# Patient Record
Sex: Female | Born: 1985
Health system: Southern US, Community
[De-identification: ages and names within clinical notes are randomized; demographics above are authoritative.]

## PROBLEM LIST (undated history)

## (undated) ENCOUNTER — Inpatient Hospital Stay (HOSPITAL_COMMUNITY): Payer: Self-pay

## (undated) DIAGNOSIS — F419 Anxiety disorder, unspecified: Secondary | ICD-10-CM

## (undated) DIAGNOSIS — J302 Other seasonal allergic rhinitis: Secondary | ICD-10-CM

## (undated) DIAGNOSIS — R87619 Unspecified abnormal cytological findings in specimens from cervix uteri: Secondary | ICD-10-CM

## (undated) DIAGNOSIS — J45909 Unspecified asthma, uncomplicated: Secondary | ICD-10-CM

## (undated) DIAGNOSIS — B999 Unspecified infectious disease: Secondary | ICD-10-CM

## (undated) DIAGNOSIS — J209 Acute bronchitis, unspecified: Secondary | ICD-10-CM

## (undated) DIAGNOSIS — IMO0002 Reserved for concepts with insufficient information to code with codable children: Secondary | ICD-10-CM

## (undated) DIAGNOSIS — E669 Obesity, unspecified: Secondary | ICD-10-CM

## (undated) DIAGNOSIS — D573 Sickle-cell trait: Secondary | ICD-10-CM

## (undated) DIAGNOSIS — A749 Chlamydial infection, unspecified: Secondary | ICD-10-CM

## (undated) DIAGNOSIS — N390 Urinary tract infection, site not specified: Secondary | ICD-10-CM

## (undated) HISTORY — DX: Other seasonal allergic rhinitis: J30.2

## (undated) HISTORY — DX: Unspecified asthma, uncomplicated: J45.909

## (undated) HISTORY — DX: Acute bronchitis, unspecified: J20.9

## (undated) HISTORY — DX: Obesity, unspecified: E66.9

## (undated) HISTORY — PX: WISDOM TOOTH EXTRACTION: SHX21

## (undated) HISTORY — PX: ABDOMINAL SURGERY: SHX537

---
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
## 2004-04-09 ENCOUNTER — Emergency Department (HOSPITAL_COMMUNITY): Admission: EM | Admit: 2004-04-09 | Discharge: 2004-04-09 | Payer: Self-pay | Admitting: Emergency Medicine

## 2007-09-04 ENCOUNTER — Emergency Department (HOSPITAL_COMMUNITY): Admission: EM | Admit: 2007-09-04 | Discharge: 2007-09-04 | Payer: Self-pay | Admitting: Emergency Medicine

## 2007-11-13 ENCOUNTER — Emergency Department (HOSPITAL_COMMUNITY): Admission: EM | Admit: 2007-11-13 | Discharge: 2007-11-13 | Payer: Self-pay | Admitting: Emergency Medicine

## 2010-12-20 ENCOUNTER — Inpatient Hospital Stay (INDEPENDENT_AMBULATORY_CARE_PROVIDER_SITE_OTHER)
Admission: RE | Admit: 2010-12-20 | Discharge: 2010-12-20 | Disposition: A | Discharge status: Home / Self Care | Payer: 59 | Source: Ambulatory Visit | Attending: Emergency Medicine | Admitting: Emergency Medicine

## 2010-12-20 DIAGNOSIS — J45909 Unspecified asthma, uncomplicated: Secondary | ICD-10-CM

## 2010-12-20 DIAGNOSIS — J309 Allergic rhinitis, unspecified: Secondary | ICD-10-CM

## 2010-12-20 LAB — POCT RAPID STREP A (OFFICE): Streptococcus, Group A Screen (Direct): NEGATIVE

## 2010-12-26 ENCOUNTER — Encounter: Payer: Self-pay | Admitting: *Deleted

## 2010-12-26 ENCOUNTER — Ambulatory Visit (INDEPENDENT_AMBULATORY_CARE_PROVIDER_SITE_OTHER): Payer: 59 | Admitting: Family Medicine

## 2010-12-26 ENCOUNTER — Encounter: Payer: Self-pay | Admitting: Family Medicine

## 2010-12-26 DIAGNOSIS — E669 Obesity, unspecified: Secondary | ICD-10-CM | POA: Insufficient documentation

## 2010-12-26 DIAGNOSIS — J209 Acute bronchitis, unspecified: Secondary | ICD-10-CM

## 2010-12-26 DIAGNOSIS — J45909 Unspecified asthma, uncomplicated: Secondary | ICD-10-CM | POA: Insufficient documentation

## 2010-12-26 DIAGNOSIS — F4320 Adjustment disorder, unspecified: Secondary | ICD-10-CM

## 2011-01-05 NOTE — Assessment & Plan Note (Signed)
Summary: NEW PT TO EST/ASTHMA/CLE  [facility]   Vital Signs:  Patient profile:   25 year old female Height:      63.5 inches Weight:      190.25 pounds BMI:     33.29 O2 Sat:      97 % on Room air Temp:     98.5 degrees F oral Pulse rate:   60 / minute Pulse rhythm:   regular BP sitting:   104 / 70  (left arm) Cuff size:   large  Vitals Entered By: Selena Batten Dance CMA Duncan Dull) (December 26, 2010 10:38 AM)  O2 Flow:  Room air CC: New patient-asthma and wants to discuss wt loss   History of Present Illness: CC: new patient, establish  moved back down from [place] recently.  1. weight - since has been here, noticing weight coming up.  has never been 190lbs.  does notice has not been staying as active as should.  2. cold - 1 wk h/o coughing and cold.  given asthma medicine for nebulizer and IM shot of something at urgent care.  cough productive of green sputum.  Started with ST, itchy eyes.  Now hoarse voice with cough.  No fevers/chills, nausea/vomiting, abd pain.  + friend sick with cold recently.  sounds dry.  having episodes of coughing leading to gaggin.  3. asthma - usually takes albuteorl inhaler.  If doesn't work, goes to hospital.  Started taking zyrtec for allergies.  last hospitalization was as child.  No intubation.  triggers are allergies, weather, stress, colds, dust, trees, 2nd hand smoke.  Never been on controller med.  normally doesn't need albuterol.  Has albuterol inh and neb at home.    4. depression - issues about being alone.  feels mother doesn't understand what she goes through living here.  has number from work for counselor, hasn't called yet.    LMP - last month, not really regular, sometimes 1 1/2 mo between periods.  no OCP. no recent well woman exam. last pap smear - 3-4 years.  went to GYN in past, told had infection (BV).    -  Date:  10/09/2002    TD booster Td  Current Medications (verified): 1)  Ventolin Hfa 108 (90 Base) Mcg/act Aers (Albuterol Sulfate)  .... 2 Puffs Q4-6 Hours As Needed Cough/sob 2)  Zyrtec Allergy 10 Mg Tabs (Cetirizine Hcl) .Marland Kitchen.. 1 By Mouth Once Daily 3)  Albuterol Sulfate (5 Mg/ml) 0.5% Nebu (Albuterol Sulfate) .... One Neb Q4-6 Hours As Needed Asthma Flare  Allergies (verified): 1)  ! Pcn  Past History:  Past Medical History: allergies asthma overweight  Past Surgical History: wisdom teeth removed  Family History: M: DM, HTN, HLD, mini CVA MGM: DM, HTN  no CAD/MI, CA  doesn't know father's side  Social History: no smoking, no EtOH, no rec drugs caffeine: 1 bottle soda/day occupation: [employer], Clinical biochemist Rep Lives alone, no pets Edu: BS degree  Review of Systems       The patient complains of prolonged cough.  The patient denies anorexia, fever, weight loss, weight gain, vision loss, decreased hearing, hoarseness, chest pain, syncope, dyspnea on exertion, peripheral edema, headaches, hemoptysis, abdominal pain, melena, hematochezia, severe indigestion/heartburn, hematuria, depression, and breast masses.         supposed to wear glasses but doesn't rest of 10 point ros neg.  Physical Exam  General:  Well-developed,well-nourished,in no acute distress; alert,appropriate and cooperative throughout examination Head:  Normocephalic and atraumatic without obvious abnormalities.  No apparent alopecia or balding. Eyes:  PERRLA, EOMI, no injection Ears:  TMs clear, covered by cermen bilaterally Nose:  nares clear Mouth:  MMM, no pharyngeal erythema Neck:  No deformities, masses, or tenderness noted.  no TM, no LAD Lungs:  Normal respiratory effort, chest expands symmetrically. Lungs are clear to auscultation, no crackles or wheezes. Heart:  Normal rate and regular rhythm. S1 and S2 normal without gallop, murmur, click, rub or other extra sounds. Abdomen:  Bowel sounds positive,abdomen soft and non-tender without masses, organomegaly or hernias noted. Msk:  No deformity or scoliosis noted of thoracic or  lumbar spine.   Pulses:  2+ rad pulses, brisk cap refill Extremities:   no pedal edema Neurologic:  CN grossly intact, station and gait intact Skin:  Intact without suspicious lesions or rashes Psych:  full affect, pleasant and cooperative    Impression & Recommendations:  Problem # 1:  ASTHMA (ICD-493.90) lungs clear currently.   doesn't sound like asthma exca currently. recommend continued scheduled albuterol for next few days.   if not improving, call us for consideration of steroid course. not on controller med.  if happening regularly, set up with controllre med.  Her updated medication list for this problem includes:    Ventolin Hfa 108 (90 Base) Mcg/act Aers (Albuterol sulfate) .Marland Kitchen... 2 puffs q4-6 hours as needed cough/sob    Albuterol Sulfate (5 Mg/ml) 0.5% Nebu (Albuterol sulfate) ..... One neb q4-6 hours as needed asthma flare  Pulmonary Functions Reviewed: O2 sat: 97 (12/26/2010)  Problem # 2:  ACUTE BRONCHITIS (ICD-466.0)  given mainly cough now >1 wk with some posttussive emesis, cover atypicals with azithromycin.  see pt instructions.  Her updated medication list for this problem includes:    Ventolin Hfa 108 (90 Base) Mcg/act Aers (Albuterol sulfate) .Marland Kitchen... 2 puffs q4-6 hours as needed cough/sob    Albuterol Sulfate (5 Mg/ml) 0.5% Nebu (Albuterol sulfate) ..... One neb q4-6 hours as needed asthma flare    Zithromax Z-pak 250 Mg Tabs (Azithromycin) ..... Use as directed    Tessalon Perles 100 Mg Caps (Benzonatate) ..... One by mouth three times a day as needed cough, swallow don't chew    Cheratussin Ac 100-10 Mg/85ml Syrp (Guaifenesin-codeine) ..... One teaspoon q6 hours as needed cough, sedation precautions  Problem # 3:  OBESITY (ICD-278.00) discussed healthy eating, discussed incorporating exercise into routine. rtc 1 mo for f/u.   pt thinks could commit to increased water during day for next visit.  Ht: 63.5 (12/26/2010)   Wt: 190.25 (12/26/2010)   BMI: 33.29  (12/26/2010)  Problem # 4:  ADJUSTMENT DISORDER (ICD-309.9) to new life in [place] discussed pt checking with counselor from work. return in 1 mo for f/u.  Problem # 5:  Preventive Health Care (ICD-V70.0) due for well woman  Complete Medication List: 1)  Ventolin Hfa 108 (90 Base) Mcg/act Aers (Albuterol sulfate) .... 2 puffs q4-6 hours as needed cough/sob 2)  Zyrtec Allergy 10 Mg Tabs (Cetirizine hcl) .Marland Kitchen.. 1 by mouth once daily 3)  Albuterol Sulfate (5 Mg/ml) 0.5% Nebu (Albuterol sulfate) .... One neb q4-6 hours as needed asthma flare 4)  Zithromax Z-pak 250 Mg Tabs (Azithromycin) .... Use as directed 5)  Tessalon Perles 100 Mg Caps (Benzonatate) .... One by mouth three times a day as needed cough, swallow don't chew 6)  Cheratussin Ac 100-10 Mg/72ml Syrp (Guaifenesin-codeine) .... One teaspoon q6 hours as needed cough, sedation precautions  Patient Instructions: 1)  Call to explore options from work about  counseling. 2)  For cough - treat with azithromycin. 3)  Cheratussin at night.  tessalon during day. 4)  continue albuterol inhaler. 5)  if worsening or fevers >101.5, or worsening shortness of breath, let us know.  we may want to see you back here. 6)  Push fluids and plenty of rest.  mucinex with plenty of fluid to break up mucous. 7)  return 1 mo for follow up.  For next month, "I will try to increase water daily, decrease juices and soda". Prescriptions: CHERATUSSIN AC 100-10 MG/5ML SYRP (GUAIFENESIN-CODEINE) one teaspoon q6 hours as needed cough, sedation precautions  #120cc x 0   Entered and Authorized by:   Eustaquio Boyden  MD   Signed by:   Eustaquio Boyden  MD on 12/26/2010   Method used:   Print then Give to Patient   RxID:   [id number] TESSALON PERLES 100 MG CAPS (BENZONATATE) one by mouth three times a day as needed cough, swallow don't chew  #30 x 0   Entered and Authorized by:   Eustaquio Boyden  MD   Signed by:   Eustaquio Boyden  MD on 12/26/2010   Method used:    Electronically to        [facility] (retail)       [address]       [place], [place]  [address]       Ph: [phone]       Fax: [phone]   RxID:   [id number] ZITHROMAX Z-PAK 250 MG TABS (AZITHROMYCIN) use as directed  #1 x 0   Entered and Authorized by:   Eustaquio Boyden  MD   Signed by:   Eustaquio Boyden  MD on 12/26/2010   Method used:   Electronically to        [facility] (retail)       [address]       [place], [place]  [address]       Ph: [phone]       Fax: [phone]   RxID:   [id number] VENTOLIN HFA 108 (90 BASE) MCG/ACT AERS (ALBUTEROL SULFATE) 2 puffs q4-6 hours as needed cough/sob  #1 x 11   Entered and Authorized by:   Eustaquio Boyden  MD   Signed by:   Eustaquio Boyden  MD on 12/26/2010   Method used:   Electronically to        [facility] (retail)       [address]       [place], [place]  [address]       Ph: [phone]       Fax: [phone]   RxID:   [id number] ALBUTEROL SULFATE (5 MG/ML) 0.5% NEBU (ALBUTEROL SULFATE) one neb q4-6 hours as needed asthma flare  #1 x 3   Entered and Authorized by:   Eustaquio Boyden  MD   Signed by:   Eustaquio Boyden  MD on 12/26/2010   Method used:   Electronically to        [facility] (retail)       [address]       [place], [place]  [address]       Ph: [phone]       Fax: [phone]   RxID:   [id number]  Orders Added: 1)  New Patient Level IV [[address]]    Current Allergies (reviewed today): ! PCN

## 2011-01-05 NOTE — Letter (Signed)
Summary: Out of Work  [facility] [employer] [facility] at [facility]  [address] [place], [place] [address]   Phone: [phone]  Fax: [phone]    December 26, 2010   Employee:  RYHANNA DUNSMORE    To Whom It May Concern:   For Medical reasons, please excuse the above named employee from work for the following dates:  Start:  December 26, 2010   End:  December 26, 2010   If you need additional information, please feel free to contact our office.         Sincerely,       Selena Batten Tallen Schnorr CMA (AAMA)

## 2011-01-25 ENCOUNTER — Encounter: Payer: Self-pay | Admitting: Family Medicine

## 2011-01-27 ENCOUNTER — Encounter: Payer: Self-pay | Admitting: Family Medicine

## 2011-01-27 ENCOUNTER — Ambulatory Visit (INDEPENDENT_AMBULATORY_CARE_PROVIDER_SITE_OTHER): Payer: 59 | Admitting: Family Medicine

## 2011-01-27 DIAGNOSIS — E669 Obesity, unspecified: Secondary | ICD-10-CM

## 2011-01-27 DIAGNOSIS — F4320 Adjustment disorder, unspecified: Secondary | ICD-10-CM

## 2011-01-27 DIAGNOSIS — J45909 Unspecified asthma, uncomplicated: Secondary | ICD-10-CM

## 2011-01-27 NOTE — Assessment & Plan Note (Signed)
PEF 440 L/min.  Expected for her is ~440.  Lungs clear.  Discussed avoidance of triggers, no controller med for now.

## 2011-01-27 NOTE — Patient Instructions (Signed)
I/m glad asthma is doing better. Good job with changing to water and weight loss! Now we will work on increasing exercise to maintain weight loss.  Slowly increase activity to goal of 30-45 min daily. If you feel things are becoming overwhelming, let us know to set you up with counseling. Once you find out about insurance, let us know if you'd like [place] to set you up with GYN for well woman exam as you're due. Good to see you today, call us with questions.

## 2011-01-27 NOTE — Assessment & Plan Note (Signed)
Congratulated on weight loss, recommend increased activity and discussed slowly titrating to goal of 30-75min more days than not out of week.

## 2011-01-27 NOTE — Progress Notes (Signed)
  Subjective:    Patient ID: Joanna Padilla, female    DOB: July 14, 1986, 25 y.o.   MRN: [id number]  HPI CC: f/u issues  1. Weight - weight down 5 lbs since last visit and only change has been increasing water and cutting back on juice.  Noticing more regular stools with increased water.  Not increased activity yet.  2. Adjustment issues - didn't call counselor at work.  Still sometimes with trouble adjusting to life in [place] compared to [facility].  Doesn't feel overwhelmed currently.  Lost job yesterday.  Wants to go back to school and get master's, find another job, file for unemployment.  3. Asthma - better.  Only using albuterol a few times since last checked.  triggers are allergies, weather, stress, colds, dust, trees, 2nd hand smoke.  In 2011 had 1-2 flares.  Not on controller med.    Well woman - 3-4 years ago.  Due but wants to wait as unsure about insurance with job loss.  Review of Systems Per HPI    Objective:   Physical Exam  Constitutional: She appears well-developed and well-nourished. No distress.  HENT:  Head: Normocephalic and atraumatic.  Right Ear: External ear normal.  Left Ear: External ear normal.  Mouth/Throat: Oropharynx is clear and moist. No oropharyngeal exudate.  Eyes: Conjunctivae and EOM are normal. Pupils are equal, round, and reactive to light. No scleral icterus.  Neck: Normal range of motion. Neck supple. No thyromegaly present.  Cardiovascular: Normal rate, regular rhythm, normal heart sounds and intact distal pulses.   No murmur heard. Pulmonary/Chest: Effort normal and breath sounds normal. No respiratory distress. She has no wheezes. She has no rales.  Lymphadenopathy:    She has no cervical adenopathy.  Neurological: She is alert.  Skin: Skin is warm and dry. No rash noted.  Psychiatric: She has a normal mood and affect. Her behavior is normal. Judgment and thought content normal.          Assessment & Plan:

## 2011-01-27 NOTE — Assessment & Plan Note (Signed)
In setting of recent job loss exacerbating but she feels she's calm and handling it well.  Has plan to return either to school or find new job closer to what her degree is. Discussed if overwhelmed, let us know for referral to counseling.

## 2011-05-01 ENCOUNTER — Encounter (HOSPITAL_COMMUNITY): Payer: Self-pay | Admitting: *Deleted

## 2011-05-01 ENCOUNTER — Inpatient Hospital Stay (HOSPITAL_COMMUNITY)
Admission: AD | Admit: 2011-05-01 | Discharge: 2011-05-01 | Disposition: A | Discharge status: Home / Self Care | Payer: Medicaid Other | Source: Ambulatory Visit | Attending: Obstetrics & Gynecology | Admitting: Obstetrics & Gynecology

## 2011-05-01 DIAGNOSIS — R5381 Other malaise: Secondary | ICD-10-CM | POA: Insufficient documentation

## 2011-05-01 DIAGNOSIS — R5383 Other fatigue: Secondary | ICD-10-CM | POA: Insufficient documentation

## 2011-05-01 DIAGNOSIS — Z3490 Encounter for supervision of normal pregnancy, unspecified, unspecified trimester: Secondary | ICD-10-CM

## 2011-05-01 DIAGNOSIS — Z3480 Encounter for supervision of other normal pregnancy, unspecified trimester: Secondary | ICD-10-CM

## 2011-05-01 DIAGNOSIS — R110 Nausea: Secondary | ICD-10-CM | POA: Insufficient documentation

## 2011-05-01 LAB — URINALYSIS, ROUTINE W REFLEX MICROSCOPIC
Nitrite: NEGATIVE
Specific Gravity, Urine: <1.005 — ABNORMAL LOW (ref 1.005–1.030)
Urobilinogen, UA: 0.2 mg/dL (ref 0.0–1.0)
pH: 5.5 (ref 5.0–8.0)

## 2011-05-01 LAB — POCT PREGNANCY, URINE: Preg Test, Ur: POSITIVE

## 2011-05-01 NOTE — ED Provider Notes (Signed)
History     Chief Complaint  Patient presents with  . Nausea  . Fatigue   HPI  25 yo G1 @ [redacted]w[redacted]d. Lightheaded and nauseous this afternoon, had normal BM, felt a little better after eating spaghetti around 4PM, then ate [employer] chicken fingers, fries and sweet tea at 7 PM and felt lightheaded and nauseous. No vomiting and no diarrhea. No pain, no vaginal bleeding. Started prenatal care, had one visit, but can't remember who she is seeing, has next appt on 8/2.     Past Medical History  Diagnosis Date  . Acute bronchitis   . Unspecified adjustment reaction   . Unspecified asthma   . Obesity, unspecified   . Seasonal allergies     Past Surgical History  Procedure Date  . Wisdom tooth extraction     Family History  Problem Relation Age of Onset  . Diabetes Mother   . Hypertension Mother   . Hyperlipidemia Mother   . Transient ischemic attack Mother   . Diabetes Maternal Grandmother   . Hypertension Maternal Grandmother     History  Substance Use Topics  . Smoking status: Never Smoker   . Smokeless tobacco: Not on file  . Alcohol Use: No    Allergies:  Allergies  Allergen Reactions  . Penicillins Other (See Comments)    REACTION: Hives    No prescriptions prior to admission    Review of Systems  Constitutional: Positive for malaise/fatigue. Negative for fever and chills.  Respiratory: Negative.   Cardiovascular: Negative.   Gastrointestinal: Positive for nausea. Negative for vomiting, abdominal pain, diarrhea and constipation.  Genitourinary: Negative for dysuria, urgency, frequency, hematuria and flank pain.       Negative for vaginal bleeding, cramping/contractions  Musculoskeletal: Negative.   Neurological: Positive for dizziness.  Psychiatric/Behavioral: Negative.    Physical Exam   Blood pressure 123/65, pulse 78, temperature 98.3 F (36.8 C), temperature source Oral, resp. rate 16, height 5\' 3"  (1.6 m), weight 89.721 kg (197 lb 12.8 oz), last  menstrual period 03/16/2011.  Physical Exam  Constitutional: She is oriented to person, place, and time. She appears well-developed and well-nourished. No distress.  Cardiovascular: Normal rate and regular rhythm.   Respiratory: Effort normal.  GI: Soft. Bowel sounds are normal. She exhibits no distension and no mass. There is no tenderness. There is no rebound and no guarding.  Musculoskeletal: Normal range of motion.  Neurological: She is alert and oriented to person, place, and time.  Skin: Skin is warm and dry.  Psychiatric: She has a normal mood and affect.    MAU Course  Procedures    Assessment and Plan  25 yo G1P0 @ [redacted] wks EGA, normal pregnancy discomfort Rev'd normal pregnancy symptoms and warning signs Rev'd comfort measures F/U as scheduled or sooner as needed  Jontavius Rabalais 05/01/2011, 11:43 PM

## 2011-05-01 NOTE — Progress Notes (Signed)
Pt feeling nausea, weakness, frequency with urination.  Pt had a bleu cheese burger last night, woke up with nausea, no vomiting or diarrhea.

## 2011-07-15 ENCOUNTER — Emergency Department (HOSPITAL_COMMUNITY)
Admission: EM | Admit: 2011-07-15 | Discharge: 2011-07-15 | Disposition: A | Discharge status: Home / Self Care | Payer: Medicaid Other | Attending: Emergency Medicine | Admitting: Emergency Medicine

## 2011-07-15 ENCOUNTER — Emergency Department (HOSPITAL_COMMUNITY): Payer: Medicaid Other

## 2011-07-15 DIAGNOSIS — O2390 Unspecified genitourinary tract infection in pregnancy, unspecified trimester: Secondary | ICD-10-CM | POA: Insufficient documentation

## 2011-07-15 DIAGNOSIS — J45909 Unspecified asthma, uncomplicated: Secondary | ICD-10-CM | POA: Insufficient documentation

## 2011-07-15 DIAGNOSIS — A499 Bacterial infection, unspecified: Secondary | ICD-10-CM | POA: Insufficient documentation

## 2011-07-15 DIAGNOSIS — B9689 Other specified bacterial agents as the cause of diseases classified elsewhere: Secondary | ICD-10-CM | POA: Insufficient documentation

## 2011-07-15 DIAGNOSIS — O208 Other hemorrhage in early pregnancy: Secondary | ICD-10-CM | POA: Insufficient documentation

## 2011-07-15 DIAGNOSIS — N760 Acute vaginitis: Secondary | ICD-10-CM | POA: Insufficient documentation

## 2011-07-15 DIAGNOSIS — R109 Unspecified abdominal pain: Secondary | ICD-10-CM | POA: Insufficient documentation

## 2011-07-15 LAB — URINALYSIS, ROUTINE W REFLEX MICROSCOPIC
Bilirubin Urine: NEGATIVE
Glucose, UA: NEGATIVE mg/dL
Hgb urine dipstick: NEGATIVE
Ketones, ur: NEGATIVE mg/dL
Protein, ur: NEGATIVE mg/dL
pH: 6 (ref 5.0–8.0)

## 2011-07-15 LAB — WET PREP, GENITAL
Trich, Wet Prep: NONE SEEN
Yeast Wet Prep HPF POC: NONE SEEN

## 2011-07-15 LAB — URINE MICROSCOPIC-ADD ON

## 2011-07-17 LAB — GC/CHLAMYDIA PROBE AMP, GENITAL: Chlamydia, DNA Probe: NEGATIVE

## 2011-07-17 LAB — URINE CULTURE
Colony Count: 85000
Culture  Setup Time: 201210070157

## 2011-07-18 LAB — I-STAT 8, (EC8 V) (CONVERTED LAB)
Acid-Base Excess: 1
BUN: 10
Chloride: 106
HCT: 47 — ABNORMAL HIGH
Operator id: 146091
Potassium: 4
pH, Ven: 7.362 — ABNORMAL HIGH

## 2011-07-18 LAB — DIFFERENTIAL
Basophils Relative: 1
Eosinophils Relative: 1
Monocytes Absolute: 0.3
Monocytes Relative: 5
Neutro Abs: 4

## 2011-07-18 LAB — CBC
HCT: 41.6
Hemoglobin: 13.8
MCHC: 33.1
RBC: 4.95

## 2011-07-18 LAB — POCT I-STAT CREATININE: Creatinine, Ser: 1

## 2011-07-18 LAB — RAPID URINE DRUG SCREEN, HOSP PERFORMED: Barbiturates: NOT DETECTED

## 2011-08-29 ENCOUNTER — Emergency Department (HOSPITAL_COMMUNITY)
Admission: EM | Admit: 2011-08-29 | Discharge: 2011-08-30 | Disposition: A | Discharge status: Home / Self Care | Payer: Medicaid Other | Attending: Emergency Medicine | Admitting: Emergency Medicine

## 2011-08-29 ENCOUNTER — Encounter (HOSPITAL_COMMUNITY): Payer: Self-pay | Admitting: Emergency Medicine

## 2011-08-29 DIAGNOSIS — F329 Major depressive disorder, single episode, unspecified: Secondary | ICD-10-CM

## 2011-08-29 DIAGNOSIS — J45909 Unspecified asthma, uncomplicated: Secondary | ICD-10-CM | POA: Insufficient documentation

## 2011-08-29 DIAGNOSIS — J309 Allergic rhinitis, unspecified: Secondary | ICD-10-CM | POA: Insufficient documentation

## 2011-08-29 DIAGNOSIS — Z79899 Other long term (current) drug therapy: Secondary | ICD-10-CM | POA: Insufficient documentation

## 2011-08-29 DIAGNOSIS — E669 Obesity, unspecified: Secondary | ICD-10-CM | POA: Insufficient documentation

## 2011-08-29 DIAGNOSIS — F32A Depression, unspecified: Secondary | ICD-10-CM

## 2011-08-29 DIAGNOSIS — R45851 Suicidal ideations: Secondary | ICD-10-CM | POA: Insufficient documentation

## 2011-08-29 LAB — RAPID URINE DRUG SCREEN, HOSP PERFORMED
Barbiturates: NOT DETECTED
Benzodiazepines: NOT DETECTED
Cocaine: NOT DETECTED
Tetrahydrocannabinol: NOT DETECTED

## 2011-08-29 LAB — COMPREHENSIVE METABOLIC PANEL
BUN: 5 mg/dL — ABNORMAL LOW (ref 6–23)
CO2: 25 mEq/L (ref 19–32)
Calcium: 10.6 mg/dL — ABNORMAL HIGH (ref 8.4–10.5)
Creatinine, Ser: 0.52 mg/dL (ref 0.50–1.10)
GFR calc Af Amer: >90 mL/min (ref >90)
GFR calc non Af Amer: >90 mL/min (ref >90)
Glucose, Bld: 128 mg/dL — ABNORMAL HIGH (ref 70–99)

## 2011-08-29 LAB — CBC
Hemoglobin: 11.4 g/dL — ABNORMAL LOW (ref 12.0–15.0)
MCH: 28.2 pg (ref 26.0–34.0)
MCV: 82.7 fL (ref 78.0–100.0)
RBC: 4.04 MIL/uL (ref 3.87–5.11)

## 2011-08-29 MED ORDER — ACETAMINOPHEN 325 MG PO TABS: 650.0000 mg | ORAL_TABLET | ORAL | Status: DC | PRN
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
## 2011-08-30 ENCOUNTER — Encounter (HOSPITAL_COMMUNITY): Payer: Self-pay | Admitting: *Deleted

## 2011-08-30 NOTE — ED Notes (Signed)
Pt discharged to self and verbalizes understanding of discharge instructions. Departs unit on safe condition.
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
## 2012-10-09 NOTE — L&D Delivery Note (Signed)
Called to see the patient after delivery because of pain in vagina and possible and vaginal hematoma.  Epidural was redosed.  Pt had adequate pain control.  Upon exam, no cervical lacerations were seen.  Pt was seen to have a left side wall vaginal hematoma measuring about 4 cm.  It was stable in size.  No further growth.  Her hemoglobin is stable.  There was a small laceration on the vaginal side wall which allowed the hematoma to drain.  Second degree periurethral laceration was still bleeding.  A figure of eight suture was placed to obtain hemostasis.  2- chromic was used.  I will plan to check serial hemoglobin levels q 4 hrs.  Keep pt NPO until second hemoglobin is done.  Ice and pressure to the perineum.  The pt understands and agrees with the plan

## 2012-10-09 NOTE — L&D Delivery Note (Signed)
Delivery Note At 3:17 PM a viable female, "Joanna Padilla", was delivered via VBAC, Spontaneous (Presentation: Right Occiput Anterior).  APGAR: 9, 9; weight .   Placenta status: Intact, Spontaneous.  Cord: 3 vessels with the following complications: None.  Cord pH: NA  Anesthesia: Epidural  Episiotomy: None Lacerations: Periurethral Suture Repair: 3.0 monocryl. Est. Blood Loss (mL): 200  Mom to postpartum.  Baby to skin to skin. Patient wants tubal--consent signed yesterday.  Will have pp visit at 4 weeks to prepare for tubal by 6 weeks.Marland Kitchen  Nigel Bridgeman 05/27/2013, 3:55 PM

## 2012-10-28 ENCOUNTER — Inpatient Hospital Stay (HOSPITAL_COMMUNITY): Payer: Medicaid Other

## 2012-10-28 ENCOUNTER — Encounter (HOSPITAL_COMMUNITY): Payer: Self-pay | Admitting: *Deleted

## 2012-10-28 ENCOUNTER — Inpatient Hospital Stay (HOSPITAL_COMMUNITY)
Admission: AD | Admit: 2012-10-28 | Discharge: 2012-10-28 | Disposition: A | Discharge status: Home / Self Care | Payer: Medicaid Other | Source: Ambulatory Visit | Attending: Obstetrics & Gynecology | Admitting: Obstetrics & Gynecology

## 2012-10-28 DIAGNOSIS — R109 Unspecified abdominal pain: Secondary | ICD-10-CM | POA: Insufficient documentation

## 2012-10-28 DIAGNOSIS — O26899 Other specified pregnancy related conditions, unspecified trimester: Secondary | ICD-10-CM

## 2012-10-28 DIAGNOSIS — O219 Vomiting of pregnancy, unspecified: Secondary | ICD-10-CM

## 2012-10-28 DIAGNOSIS — O99891 Other specified diseases and conditions complicating pregnancy: Secondary | ICD-10-CM | POA: Insufficient documentation

## 2012-10-28 DIAGNOSIS — O210 Mild hyperemesis gravidarum: Secondary | ICD-10-CM | POA: Insufficient documentation

## 2012-10-28 HISTORY — DX: Sickle-cell trait: D57.3

## 2012-10-28 LAB — URINALYSIS, ROUTINE W REFLEX MICROSCOPIC
Glucose, UA: NEGATIVE mg/dL
Hgb urine dipstick: NEGATIVE
Protein, ur: NEGATIVE mg/dL

## 2012-10-28 LAB — WET PREP, GENITAL
Trich, Wet Prep: NONE SEEN
Yeast Wet Prep HPF POC: NONE SEEN

## 2012-10-28 LAB — CBC
HCT: 33.9 % — ABNORMAL LOW (ref 36.0–46.0)
MCH: 27.9 pg (ref 26.0–34.0)
MCHC: 33.9 g/dL (ref 30.0–36.0)
MCV: 82.3 fL (ref 78.0–100.0)
RDW: 12.8 % (ref 11.5–15.5)

## 2012-10-28 LAB — RAPID URINE DRUG SCREEN, HOSP PERFORMED
Amphetamines: NOT DETECTED
Benzodiazepines: NOT DETECTED
Opiates: NOT DETECTED
Tetrahydrocannabinol: NOT DETECTED

## 2012-10-28 LAB — ABO/RH: ABO/RH(D): A POS

## 2012-10-28 LAB — COMPREHENSIVE METABOLIC PANEL
BUN: 5 mg/dL — ABNORMAL LOW (ref 6–23)
Calcium: 9.4 mg/dL (ref 8.4–10.5)
Creatinine, Ser: 0.64 mg/dL (ref 0.50–1.10)
GFR calc Af Amer: >90 mL/min (ref >90)
Glucose, Bld: 81 mg/dL (ref 70–99)
Total Protein: 7.1 g/dL (ref 6.0–8.3)

## 2012-10-28 MED ORDER — ONDANSETRON 4 MG PO TBDP
4.0000 mg | ORAL_TABLET | Freq: Once | ORAL | Status: AC
  Administered 2012-10-28: 4 mg via ORAL
  Filled 2012-10-28: qty 1

## 2012-10-28 MED ORDER — PROMETHAZINE HCL 25 MG PO TABS: 12.5000 mg | ORAL_TABLET | Freq: Four times a day (QID) | ORAL | Status: DC | PRN

## 2012-10-28 NOTE — MAU Provider Note (Signed)
History     CSN: [id number]  Arrival date and time: 10/28/12 1449   First Provider Initiated Contact with Patient 10/28/12 1513      Chief Complaint  Patient presents with  . Abdominal Pain   HPI Joanna Padilla is a 27 y.o. female @ [redacted]w[redacted]d gestation who presents to MAU with abdominal pain. The pain started 3 weeks ago. She describes the pain as sharp that comes and goes. The pain is located in the mid lower abdomen.  She rates the pain as 5/10. Associated symptoms include nausea, vomiting, feeling weak and dizzy. LMP 08/23/12. Last pap smear less than one year ago after delivery of her 65 month old infant and was abnormal and has not had follow up. No hx of STI's. Current sex partner x 4 months. The history was provided by the patient.      OB History    Grav Para Term Preterm Abortions TAB SAB Ect Mult Living   2 1 1       1       Past Medical History  Diagnosis Date  . Acute bronchitis   . Unspecified adjustment reaction   . Unspecified asthma   . Obesity, unspecified   . Seasonal allergies   . Depression   . Sickle cell trait     Past Surgical History  Procedure Date  . Wisdom tooth extraction   . Cesarean section     Family History  Problem Relation Age of Onset  . Diabetes Mother   . Hypertension Mother   . Hyperlipidemia Mother   . Transient ischemic attack Mother   . Diabetes Maternal Grandmother   . Hypertension Maternal Grandmother     History  Substance Use Topics  . Smoking status: Never Smoker   . Smokeless tobacco: Not on file  . Alcohol Use: No    Allergies:  Allergies  Allergen Reactions  . Penicillins Hives    Prescriptions prior to admission  Medication Sig Dispense Refill  . albuterol (PROVENTIL) (5 MG/ML) 0.5% nebulizer solution Take 2.5 mg by nebulization every 4 (four) hours as needed. For wheezing      . albuterol (VENTOLIN HFA) 108 (90 BASE) MCG/ACT inhaler Inhale 2 puffs into the lungs every 4 (four) hours as needed. For wheezing         Review of Systems  Constitutional: Negative for fever, chills, weight loss and malaise/fatigue (feeling tired).  HENT: Negative for ear pain, nosebleeds, congestion, sore throat and neck pain.   Eyes: Negative for blurred vision, double vision, photophobia and pain.  Respiratory: Positive for wheezing. Negative for cough and shortness of breath.   Cardiovascular: Negative for chest pain, palpitations and leg swelling.  Gastrointestinal: Positive for nausea, vomiting, abdominal pain and constipation. Negative for heartburn and diarrhea.  Genitourinary: Positive for frequency. Negative for dysuria and urgency.  Musculoskeletal: Positive for back pain. Negative for myalgias.  Skin: Negative for itching and rash.  Neurological: Positive for dizziness. Negative for sensory change, speech change, seizures, weakness and headaches.  Endo/Heme/Allergies: Does not bruise/bleed easily.  Psychiatric/Behavioral: Positive for depression. Negative for substance abuse. The patient is not nervous/anxious.    Blood pressure 117/55, pulse 59, temperature 99.1 F (37.3 C), temperature source Oral, resp. rate 18, last menstrual period 08/27/2012, SpO2 100.00%, unknown if currently breastfeeding.  Physical Exam  Nursing note and vitals reviewed. Constitutional: She is oriented to person, place, and time. She appears well-developed and well-nourished. No distress.  HENT:  Head: Normocephalic and atraumatic.  Eyes: EOM are normal.  Neck: Neck supple.  Cardiovascular: Normal rate.   Respiratory: Effort normal.  GI: Soft. There is tenderness.       Mildly tender lower abdomen with palpation. No rebound or guarding.  Genitourinary:       External genitalia without lesions. White discharge vaginal vault. Cervix long, closed, no CMT, no adnexal tenderness. Uterus slightly enlarged.   Musculoskeletal: Normal range of motion.  Neurological: She is alert and oriented to person, place, and time.  Skin: Skin is  warm and dry.  Psychiatric: She has a normal mood and affect. Her behavior is normal. Judgment and thought content normal.   Results for orders placed during the hospital encounter of 10/28/12 (from the past 24 hour(s))  GLUCOSE, CAPILLARY     Status: Normal   Collection Time   10/28/12  2:58 PM      Component Value Range   Glucose-Capillary 71  70 - 99 mg/dL  URINALYSIS, ROUTINE W REFLEX MICROSCOPIC     Status: Abnormal   Collection Time   10/28/12  3:15 PM      Component Value Range   Color, Urine YELLOW  YELLOW   APPearance CLEAR  CLEAR   Specific Gravity, Urine 1.020  1.005 - 1.030   pH 6.0  5.0 - 8.0   Glucose, UA NEGATIVE  NEGATIVE mg/dL   Hgb urine dipstick NEGATIVE  NEGATIVE   Bilirubin Urine NEGATIVE  NEGATIVE   Ketones, ur 40 (*) NEGATIVE mg/dL   Protein, ur NEGATIVE  NEGATIVE mg/dL   Urobilinogen, UA 1.0  0.0 - 1.0 mg/dL   Nitrite NEGATIVE  NEGATIVE   Leukocytes, UA NEGATIVE  NEGATIVE  URINE RAPID DRUG SCREEN (HOSP PERFORMED)     Status: Normal   Collection Time   10/28/12  3:15 PM      Component Value Range   Opiates NONE DETECTED  NONE DETECTED   Cocaine NONE DETECTED  NONE DETECTED   Benzodiazepines NONE DETECTED  NONE DETECTED   Amphetamines NONE DETECTED  NONE DETECTED   Tetrahydrocannabinol NONE DETECTED  NONE DETECTED   Barbiturates NONE DETECTED  NONE DETECTED  POCT PREGNANCY, URINE     Status: Abnormal   Collection Time   10/28/12  3:22 PM      Component Value Range   Preg Test, Ur POSITIVE (*) NEGATIVE  ABO/RH     Status: Normal (Preliminary result)   Collection Time   10/28/12  3:40 PM      Component Value Range   ABO/RH(D) A POS    CBC     Status: Abnormal   Collection Time   10/28/12  3:40 PM      Component Value Range   WBC 10.2  4.0 - 10.5 K/uL   RBC 4.12  3.87 - 5.11 MIL/uL   Hemoglobin 11.5 (*) 12.0 - 15.0 g/dL   HCT 45.4 (*) 09.8 - 11.9 %   MCV 82.3  78.0 - 100.0 fL   MCH 27.9  26.0 - 34.0 pg   MCHC 33.9  30.0 - 36.0 g/dL   RDW 14.7  82.9  - 56.2 %   Platelets 389  150 - 400 K/uL  HCG, QUANTITATIVE, PREGNANCY     Status: Abnormal   Collection Time   10/28/12  3:40 PM      Component Value Range   hCG, Beta Chain, Quant, S 42320 (*) <5 mIU/mL  COMPREHENSIVE METABOLIC PANEL     Status: Abnormal   Collection Time  10/28/12  3:40 PM      Component Value Range   Sodium 135  135 - 145 mEq/L   Potassium 4.2  3.5 - 5.1 mEq/L   Chloride 102  96 - 112 mEq/L   CO2 24  19 - 32 mEq/L   Glucose, Bld 81  70 - 99 mg/dL   BUN 5 (*) 6 - 23 mg/dL   Creatinine, Ser 4.54  0.50 - 1.10 mg/dL   Calcium 9.4  8.4 - 09.8 mg/dL   Total Protein 7.1  6.0 - 8.3 g/dL   Albumin 3.7  3.5 - 5.2 g/dL   AST 10  0 - 37 U/L   ALT 8  0 - 35 U/L   Alkaline Phosphatase 60  39 - 117 U/L   Total Bilirubin 0.4  0.3 - 1.2 mg/dL   GFR calc non Af Amer >90  >90 mL/min   GFR calc Af Amer >90  >90 mL/min    Procedures   When patient returned from ultrasound she states she is hungry.  Assessment: 27 y.o. female @ 8.[redacted] weeks gestation with abdominal pain   Nausea and vomiting in pregnancy  Plan:  Zofran 4 mg ODT now   Rx Phenergan   Zantac   Start prenatal care I have reviewed this patient's vital signs, nurses notes, appropriate labs and imaging.  I have discussed results with the patient and plan of care. Patient voices understanding.   Medication List     As of 10/28/2012  7:57 PM    START taking these medications         promethazine 25 MG tablet   Commonly known as: PHENERGAN   Take 0.5 tablets (12.5 mg total) by mouth every 6 (six) hours as needed for nausea.      CONTINUE taking these medications         * albuterol (5 MG/ML) 0.5% nebulizer solution   Commonly known as: PROVENTIL      * VENTOLIN HFA 108 (90 BASE) MCG/ACT inhaler   Generic drug: albuterol     * Notice: This list has 2 medication(s) that are the same as other medications prescribed for you. Read the directions carefully, and ask your doctor or other care provider to review  them with you.        Where to get your medications    These are the prescriptions that you need to pick up. We sent them to a specific pharmacy, so you will need to go there to get them.   [id number] [place], [place] - [address]    [address] [place] [address]    Phone: [phone]        promethazine 25 MG tablet             Nivia Gervase, RN, FNP, [facility] 10/28/2012, 5:41 PM

## 2012-10-28 NOTE — MAU Note (Signed)
RN called to admissions desk,  Pt has her head down on desk, states, "I'm so sleepy."  Pt aroused slightly when shaken, pt will not lift her head, again states she is sleepy.  Pt lifted into WC & brought to room 7.  Pt's boyfriend in room, states pt has been very sleepy today, states pt ate a waffle this a.m. Has been drinking.  Pt & her S/O state she has not taken any medication today.

## 2012-10-28 NOTE — MAU Note (Signed)
Pt states she has abdominal pain, denies bleeding.

## 2012-10-28 NOTE — MAU Provider Note (Signed)
Attestation of Attending Supervision of Advanced Practitioner (CNM/NP): Evaluation and management procedures were performed by the Advanced Practitioner under my supervision and collaboration.  I have reviewed the Advanced Practitioner's note and chart, and I agree with the management and plan.  HARRAWAY-SMITH, Bronte Sabado 8:46 PM

## 2012-10-30 LAB — GC/CHLAMYDIA PROBE AMP: CT Probe RNA: POSITIVE — AB

## 2012-10-31 ENCOUNTER — Encounter (HOSPITAL_COMMUNITY): Payer: Self-pay | Admitting: *Deleted

## 2012-10-31 ENCOUNTER — Inpatient Hospital Stay (HOSPITAL_COMMUNITY)
Admission: AD | Admit: 2012-10-31 | Discharge: 2012-10-31 | Disposition: A | Discharge status: Home / Self Care | Payer: Medicaid Other | Source: Ambulatory Visit | Attending: Obstetrics & Gynecology | Admitting: Obstetrics & Gynecology

## 2012-10-31 DIAGNOSIS — N739 Female pelvic inflammatory disease, unspecified: Secondary | ICD-10-CM | POA: Insufficient documentation

## 2012-10-31 DIAGNOSIS — K5900 Constipation, unspecified: Secondary | ICD-10-CM | POA: Insufficient documentation

## 2012-10-31 DIAGNOSIS — A5619 Other chlamydial genitourinary infection: Secondary | ICD-10-CM | POA: Insufficient documentation

## 2012-10-31 DIAGNOSIS — R5381 Other malaise: Secondary | ICD-10-CM | POA: Insufficient documentation

## 2012-10-31 DIAGNOSIS — A749 Chlamydial infection, unspecified: Secondary | ICD-10-CM

## 2012-10-31 DIAGNOSIS — O98319 Other infections with a predominantly sexual mode of transmission complicating pregnancy, unspecified trimester: Secondary | ICD-10-CM | POA: Insufficient documentation

## 2012-10-31 MED ORDER — AZITHROMYCIN 1 G PO PACK
1.0000 g | PACK | Freq: Once | ORAL | Status: DC
Start: 2012-10-31 — End: 2012-10-31

## 2012-10-31 MED ORDER — AZITHROMYCIN 1 G PO PACK: 1.0000 | PACK | Freq: Once | ORAL | Status: DC

## 2012-10-31 NOTE — ED Provider Notes (Signed)
None     Chief Complaint:  Needs treatment for STD  Joanna Padilla is  27 y.o. G2P1001 at [redacted]w[redacted]d presents complaining of Extremity Weakness constipation, and needs RX for + CHL Obstetrical/Gynecological History: Menstrual History: OB History    Grav Para Term Preterm Abortions TAB SAB Ect Mult Living   2 1 1       1       Patient's last menstrual period was 08/27/2012.     Past Medical History: Past Medical History  Diagnosis Date  . Acute bronchitis   . Unspecified adjustment reaction   . Unspecified asthma   . Obesity, unspecified   . Seasonal allergies   . Depression   . Sickle cell trait     Past Surgical History: Past Surgical History  Procedure Date  . Wisdom tooth extraction   . Cesarean section     Family History: Family History  Problem Relation Age of Onset  . Diabetes Mother   . Hypertension Mother   . Hyperlipidemia Mother   . Transient ischemic attack Mother   . Diabetes Maternal Grandmother   . Hypertension Maternal Grandmother     Social History: History  Substance Use Topics  . Smoking status: Never Smoker   . Smokeless tobacco: Not on file  . Alcohol Use: No    Allergies:  Allergies  Allergen Reactions  . Penicillins Hives    Meds:  Prescriptions prior to admission  Medication Sig Dispense Refill  . albuterol (PROVENTIL) (5 MG/ML) 0.5% nebulizer solution Take 2.5 mg by nebulization every 4 (four) hours as needed. For wheezing      . albuterol (VENTOLIN HFA) 108 (90 BASE) MCG/ACT inhaler Inhale 2 puffs into the lungs every 4 (four) hours as needed. For wheezing      . promethazine (PHENERGAN) 25 MG tablet Take 0.5 tablets (12.5 mg total) by mouth every 6 (six) hours as needed for nausea.  20 tablet  0    Review of Systems - Please refer to the aforementioned patients' reports.     Physical Exam  Blood pressure 110/51, pulse 77, temperature 98.1 F (36.7 C), resp. rate 18, height 5\' 4"  (1.626 m), weight 184 lb (83.462 kg), last  menstrual period 08/27/2012, SpO2 100.00%, unknown if currently breastfeeding. GENERAL: Well-developed, well-nourished female in no acute distress.  LUNGS: Clear to auscultation bilaterally.  HEART: Regular rate and rhythm. ABDOMEN: Soft, nontender, nondistended, + BS  EXTREMITIES: Nontender, no edema, 2+ distal pulses. Labs: Recent Results (from the past 24 hour(s))  GLUCOSE, CAPILLARY   Collection Time   10/31/12  4:17 PM      Component Value Range   Glucose-Capillary 95  70 - 99 mg/dL   Imaging Studies:  US Ob Comp Less 14 Wks  10/28/2012  OBSTETRICAL ULTRASOUND: This exam was performed within a [place] [facility]. The OB US report was generated in the AS system, and faxed to the ordering physician.   This report is also available in [employer] and in the [employer]. See AS Obstetric US report.    Assessment: Joanna Padilla is  27 y.o. G2P1001 at [redacted]w[redacted]d presents with + chlamydia.  Plan: Azithromycin 1gm PO sent to pharmacy;  Try to Increase fluids/fiber; may take stool softners; keep something on stomach q 2 hours to help combat weakness  CRESENZO-DISHMAN,Abubakar Crispo 1/23/20146:09 PM

## 2012-10-31 NOTE — MAU Note (Addendum)
Here the other day with weakness, CBG low on that day, improved with food, here today due to call back for STD positive, still feeling weak and constipated,  No prenatal care, still feeling "bad and weak, with abd discomfort. Has  38 month old infant with her

## 2012-12-12 NOTE — MAU Provider Note (Signed)
Chief Complaint: Needs treatment for STD  AMAIRANY SCHUMPERT is 27 y.o. G2P1001 at [redacted]w[redacted]d presents complaining of Extremity Weakness  constipation, and needs RX for + CHL  Obstetrical/Gynecological History:  Menstrual History:  OB History    Grav  Para  Term  Preterm  Abortions  TAB  SAB  Ect  Mult  Living    2  1  1        1       Patient's last menstrual period was 08/27/2012.   Past Medical History:  Past Medical History   Diagnosis  Date   .  Acute bronchitis    .  Unspecified adjustment reaction    .  Unspecified asthma    .  Obesity, unspecified    .  Seasonal allergies    .  Depression    .  Sickle cell trait     Past Surgical History:  Past Surgical History   Procedure  Date   .  Wisdom tooth extraction    .  Cesarean section     Family History:  Family History   Problem  Relation  Age of Onset   .  Diabetes  Mother    .  Hypertension  Mother    .  Hyperlipidemia  Mother    .  Transient ischemic attack  Mother    .  Diabetes  Maternal Grandmother    .  Hypertension  Maternal Grandmother     Social History:  History   Substance Use Topics   .  Smoking status:  Never Smoker   .  Smokeless tobacco:  Not on file   .  Alcohol Use:  No    Allergies:  Allergies   Allergen  Reactions   .  Penicillins  Hives    Meds:  Prescriptions prior to admission   Medication  Sig  Dispense  Refill   .  albuterol (PROVENTIL) (5 MG/ML) 0.5% nebulizer solution  Take 2.5 mg by nebulization every 4 (four) hours as needed. For wheezing     .  albuterol (VENTOLIN HFA) 108 (90 BASE) MCG/ACT inhaler  Inhale 2 puffs into the lungs every 4 (four) hours as needed. For wheezing     .  promethazine (PHENERGAN) 25 MG tablet  Take 0.5 tablets (12.5 mg total) by mouth every 6 (six) hours as needed for nausea.  20 tablet  0    Review of Systems - Please refer to the aforementioned patients' reports.  Physical Exam   Blood pressure 110/51, pulse 77, temperature 98.1 F (36.7 C), resp. rate 18,  height 5\' 4"  (1.626 m), weight 184 lb (83.462 kg), last menstrual period 08/27/2012, SpO2 100.00%, unknown if currently breastfeeding.  GENERAL: Well-developed, well-nourished female in no acute distress.  LUNGS: Clear to auscultation bilaterally.  HEART: Regular rate and rhythm.  ABDOMEN: Soft, nontender, nondistended, + BS  EXTREMITIES: Nontender, no edema, 2+ distal pulses.  Labs:  Recent Results (from the past 24 hour(s))   GLUCOSE, CAPILLARY    Collection Time    10/31/12 4:17 PM   Component  Value  Range    Glucose-Capillary  95  70 - 99 mg/dL    Imaging Studies:  US Ob Comp Less 14 Wks  10/28/2012 OBSTETRICAL ULTRASOUND: This exam was performed within a [facility]. The OB US report was generated in the AS system, and faxed to the ordering physician. This report is also available in [employer] and in the [employer].  See [facility] Obstetric US report.  Assessment:  BRITTIANY WIEHE is 27 y.o. G2P1001 at [redacted]w[redacted]d presents with + chlamydia.  Plan:  Azithromycin 1gm PO sent to pharmacy; Try to Increase fluids/fiber; may take stool softners; keep something on stomach q 2 hours to help combat weakness  CRESENZO-DISHMAN,FRANCES  1/23/20146:09 PM Agree with note ARNOLD,JAMES

## 2012-12-26 ENCOUNTER — Encounter (HOSPITAL_COMMUNITY): Payer: Self-pay | Admitting: *Deleted

## 2012-12-26 ENCOUNTER — Inpatient Hospital Stay (HOSPITAL_COMMUNITY)
Admission: AD | Admit: 2012-12-26 | Discharge: 2012-12-26 | Disposition: A | Discharge status: Home / Self Care | Payer: Medicaid Other | Source: Ambulatory Visit | Attending: Obstetrics & Gynecology | Admitting: Obstetrics & Gynecology

## 2012-12-26 DIAGNOSIS — K5289 Other specified noninfective gastroenteritis and colitis: Secondary | ICD-10-CM | POA: Insufficient documentation

## 2012-12-26 DIAGNOSIS — O99891 Other specified diseases and conditions complicating pregnancy: Secondary | ICD-10-CM | POA: Insufficient documentation

## 2012-12-26 DIAGNOSIS — O210 Mild hyperemesis gravidarum: Secondary | ICD-10-CM | POA: Insufficient documentation

## 2012-12-26 DIAGNOSIS — R109 Unspecified abdominal pain: Secondary | ICD-10-CM | POA: Insufficient documentation

## 2012-12-26 DIAGNOSIS — O219 Vomiting of pregnancy, unspecified: Secondary | ICD-10-CM

## 2012-12-26 LAB — URINE MICROSCOPIC-ADD ON

## 2012-12-26 LAB — URINALYSIS, ROUTINE W REFLEX MICROSCOPIC
Bilirubin Urine: NEGATIVE
Glucose, UA: NEGATIVE mg/dL
Ketones, ur: 15 mg/dL — AB
Protein, ur: NEGATIVE mg/dL
pH: 5.5 (ref 5.0–8.0)

## 2012-12-26 MED ORDER — ONDANSETRON HCL 4 MG PO TABS: 4.0000 mg | ORAL_TABLET | Freq: Four times a day (QID) | ORAL | Status: DC

## 2012-12-26 MED ORDER — PROMETHAZINE HCL 25 MG PO TABS: 25.0000 mg | ORAL_TABLET | Freq: Four times a day (QID) | ORAL | Status: DC | PRN

## 2012-12-26 MED ORDER — ONDANSETRON 8 MG PO TBDP
8.0000 mg | ORAL_TABLET | Freq: Once | ORAL | Status: AC
  Administered 2012-12-26: 8 mg via ORAL
  Filled 2012-12-26: qty 1

## 2012-12-26 MED ORDER — PROMETHAZINE HCL 25 MG/ML IJ SOLN
25.0000 mg | Freq: Once | INTRAVENOUS | Status: AC
  Administered 2012-12-26: 25 mg via INTRAVENOUS
  Filled 2012-12-26: qty 1

## 2012-12-26 MED ORDER — FAMOTIDINE 20 MG PO TABS
20.0000 mg | ORAL_TABLET | Freq: Once | ORAL | Status: AC
  Administered 2012-12-26: 20 mg via ORAL
  Filled 2012-12-26: qty 1

## 2012-12-26 NOTE — MAU Note (Signed)
N/V/D since 2300 last night with intermittant lower abd cramping. Pt thinks she ate some bad food approx 1400 yesterday.  States she has been feeling a vaginal pressure for the past 7 weeks.

## 2012-12-26 NOTE — MAU Provider Note (Addendum)
History     CSN: [id number]  Arrival date and time: 12/26/12 4098   None     Chief Complaint  Patient presents with  . Diarrhea  . Emesis   HPI 27 y.o. G2P1001 at [redacted]w[redacted]d with n/v/d x 6 hours. Thinks she may have food poisoning, ate at [employer] last night. Generalized abdominal pain/cramping. No bleeding. No fever.   Past Medical History  Diagnosis Date  . Acute bronchitis   . Unspecified adjustment reaction   . Unspecified asthma   . Obesity, unspecified   . Seasonal allergies   . Depression   . Sickle cell trait     Past Surgical History  Procedure Laterality Date  . Wisdom tooth extraction    . Cesarean section      Family History  Problem Relation Age of Onset  . Diabetes Mother   . Hypertension Mother   . Hyperlipidemia Mother   . Transient ischemic attack Mother   . Diabetes Maternal Grandmother   . Hypertension Maternal Grandmother     History  Substance Use Topics  . Smoking status: Never Smoker   . Smokeless tobacco: Not on file  . Alcohol Use: No    Allergies:  Allergies  Allergen Reactions  . Penicillins Hives    Prescriptions prior to admission  Medication Sig Dispense Refill  . albuterol (PROVENTIL) (5 MG/ML) 0.5% nebulizer solution Take 2.5 mg by nebulization every 4 (four) hours as needed. For wheezing      . albuterol (VENTOLIN HFA) 108 (90 BASE) MCG/ACT inhaler Inhale 2 puffs into the lungs every 4 (four) hours as needed. For wheezing      . azithromycin (ZITHROMAX) 1 G powder Take 1 packet by mouth once.  1 each  0  . promethazine (PHENERGAN) 25 MG tablet Take 0.5 tablets (12.5 mg total) by mouth every 6 (six) hours as needed for nausea.  20 tablet  0    Review of Systems  Constitutional: Negative.  Negative for fever and chills.  Respiratory: Negative.   Cardiovascular: Negative.   Gastrointestinal: Positive for heartburn, nausea, vomiting, abdominal pain and diarrhea. Negative for constipation.  Genitourinary: Negative.   Negative for dysuria, urgency, frequency, hematuria and flank pain.       Negative for vaginal bleeding, vaginal discharge  Musculoskeletal: Negative.   Neurological: Negative.   Psychiatric/Behavioral: Negative.    Physical Exam   Blood pressure 121/47, pulse 83, temperature 98.3 F (36.8 C), temperature source Oral, resp. rate 20, height 5\' 3"  (1.6 m), weight 186 lb (84.369 kg), last menstrual period 08/27/2012, SpO2 100.00%.  Physical Exam  Nursing note and vitals reviewed. Constitutional: She is oriented to person, place, and time. She appears well-developed and well-nourished. No distress.  Cardiovascular: Normal rate.   Respiratory: Effort normal.  GI: Soft. She exhibits no distension and no mass. There is tenderness (upper abd). There is no rebound and no guarding.  Musculoskeletal: Normal range of motion.  Neurological: She is alert and oriented to person, place, and time.  Skin: Skin is warm and dry.  Psychiatric: She has a normal mood and affect.    MAU Course  Procedures  Zofran ODT 8 mg and Pepcid 20 mg PO given in MAU  Urine pending  Assessment and Plan  Care assumed by Joseph Berkshire, PA  [facility] 12/26/2012, 7:00 AM   [phone] - Care assumed from Georges Mouse, CNM  Patient had two episodes of vomiting here in MAU. Able to take in sips of PO and give sample  for UA.   Results for orders placed during the hospital encounter of 12/26/12 (from the past 24 hour(s))  URINALYSIS, ROUTINE W REFLEX MICROSCOPIC     Status: Abnormal   Collection Time    12/26/12  9:53 AM      Result Value Range   Color, Urine YELLOW  YELLOW   APPearance HAZY (*) CLEAR   Specific Gravity, Urine >1.030 (*) 1.005 - 1.030   pH 5.5  5.0 - 8.0   Glucose, UA NEGATIVE  NEGATIVE mg/dL   Hgb urine dipstick TRACE (*) NEGATIVE   Bilirubin Urine NEGATIVE  NEGATIVE   Ketones, ur 15 (*) NEGATIVE mg/dL   Protein, ur NEGATIVE  NEGATIVE mg/dL   Urobilinogen, UA 0.2  0.0 - 1.0 mg/dL    Nitrite NEGATIVE  NEGATIVE   Leukocytes, UA SMALL (*) NEGATIVE  URINE MICROSCOPIC-ADD ON     Status: Abnormal   Collection Time    12/26/12  9:53 AM      Result Value Range   Squamous Epithelial / LPF FEW (*) RARE   WBC, UA 3-6  <3 WBC/hpf   RBC / HPF 0-2  <3 RBC/hpf   Bacteria, UA FEW (*) RARE   Urine-Other MUCOUS PRESENT      MDM Based on UA results and active vomiting in MAU patient to receive 1L IV LR with Phenergan infusion Patient reports significant improvement in symptoms.   A: Gastroenteritis  P: Discharge home Rx for phenergan and zofran sent to patient's pharmacy Patient encouraged to increase PO hydration as tolerated Patient may return to MAU if her condition were to change or worsen  Freddi Starr, PA-C  12/26/2012 11:29 AM  Pt called and stated she vomited the Zithromax powder 1 gm and wants prescription for tablets- Zithromax 1 mg tablets x1 e-scribed to pt's pharmacy [facility]

## 2012-12-26 NOTE — MAU Note (Signed)
Presents ambulatory via EMS, states she thinks she has food poisoning. Started having diarrhea 6 hours ago, and then nausea and vomiting x 2 hours. abd cramping.

## 2012-12-27 LAB — URINE CULTURE

## 2013-01-02 ENCOUNTER — Other Ambulatory Visit: Payer: Medicaid Other

## 2013-01-03 ENCOUNTER — Other Ambulatory Visit: Payer: Medicaid Other

## 2013-01-03 MED ORDER — AZITHROMYCIN 250 MG PO TABS: 1000.0000 mg | ORAL_TABLET | Freq: Once | ORAL | Status: DC

## 2013-01-09 ENCOUNTER — Other Ambulatory Visit: Payer: Medicaid Other

## 2013-01-15 ENCOUNTER — Inpatient Hospital Stay (HOSPITAL_COMMUNITY)
Admission: AD | Admit: 2013-01-15 | Discharge: 2013-01-15 | Disposition: A | Discharge status: Home / Self Care | Payer: Medicaid Other | Source: Ambulatory Visit | Attending: Obstetrics & Gynecology | Admitting: Obstetrics & Gynecology

## 2013-01-15 ENCOUNTER — Encounter (HOSPITAL_COMMUNITY): Payer: Self-pay | Admitting: *Deleted

## 2013-01-15 DIAGNOSIS — O2390 Unspecified genitourinary tract infection in pregnancy, unspecified trimester: Secondary | ICD-10-CM | POA: Insufficient documentation

## 2013-01-15 DIAGNOSIS — O2342 Unspecified infection of urinary tract in pregnancy, second trimester: Secondary | ICD-10-CM

## 2013-01-15 DIAGNOSIS — O99891 Other specified diseases and conditions complicating pregnancy: Secondary | ICD-10-CM | POA: Insufficient documentation

## 2013-01-15 DIAGNOSIS — E162 Hypoglycemia, unspecified: Secondary | ICD-10-CM | POA: Insufficient documentation

## 2013-01-15 DIAGNOSIS — N390 Urinary tract infection, site not specified: Secondary | ICD-10-CM | POA: Insufficient documentation

## 2013-01-15 DIAGNOSIS — R5381 Other malaise: Secondary | ICD-10-CM | POA: Insufficient documentation

## 2013-01-15 HISTORY — DX: Chlamydial infection, unspecified: A74.9

## 2013-01-15 LAB — URINALYSIS, ROUTINE W REFLEX MICROSCOPIC
Bilirubin Urine: NEGATIVE
Glucose, UA: NEGATIVE mg/dL
Hgb urine dipstick: NEGATIVE
Ketones, ur: NEGATIVE mg/dL
Specific Gravity, Urine: 1.015 (ref 1.005–1.030)
pH: 5.5 (ref 5.0–8.0)

## 2013-01-15 LAB — URINE MICROSCOPIC-ADD ON

## 2013-01-15 MED ORDER — NITROFURANTOIN MONOHYD MACRO 100 MG PO CAPS: 100.0000 mg | ORAL_CAPSULE | Freq: Two times a day (BID) | ORAL | Status: DC

## 2013-01-15 NOTE — MAU Note (Signed)
Pt was at work after eating [employer] pieces and sprite and started to feel really sick.  She states her head felt heavy and became very sleepy.  Pt states she became so weak she couldn't do anything at work.  Pt denies any vaginal bleeding and ROM.  Pt states she had meds to treat her STD (CL) but hasn't taken the pill yet.

## 2013-01-15 NOTE — MAU Provider Note (Signed)
History     CSN: [id number]  Arrival date and time: 01/15/13 1913   None     No chief complaint on file.  HPI  Joanna Padilla is a 27 y.o. G2P1001 at [redacted]w[redacted]d who presents today because she feels really tired and weak. She states that she did not eat all, but then around 1700 she ate some candy and drank a soda. After she ate the candy she started to feel weak and shaky and very tired. She does not get time to take breaks at work. Her employer asked her to bring a note for extended breakes.   Past Medical History  Diagnosis Date  . Acute bronchitis   . Unspecified adjustment reaction   . Unspecified asthma   . Obesity, unspecified   . Seasonal allergies   . Sickle cell trait   . Chlamydia infection     Past Surgical History  Procedure Laterality Date  . Wisdom tooth extraction    . Cesarean section      Family History  Problem Relation Age of Onset  . Diabetes Mother   . Hypertension Mother   . Hyperlipidemia Mother   . Transient ischemic attack Mother   . Diabetes Maternal Grandmother   . Hypertension Maternal Grandmother     History  Substance Use Topics  . Smoking status: Never Smoker   . Smokeless tobacco: Not on file  . Alcohol Use: No    Allergies:  Allergies  Allergen Reactions  . Penicillins Hives    Prescriptions prior to admission  Medication Sig Dispense Refill  . albuterol (VENTOLIN HFA) 108 (90 BASE) MCG/ACT inhaler Inhale 2 puffs into the lungs every 4 (four) hours as needed. For wheezing      . azithromycin (ZITHROMAX) 250 MG tablet Take 250-500 mg by mouth See admin instructions. Take first 2 tablets together, then 1 every day until finished. Prescribed 01-14-13 for chlamydia.      . [DISCONTINUED] azithromycin (ZITHROMAX) 250 MG tablet Take 4 tablets (1,000 mg total) by mouth once. Take first 2 tablets together, then 1 every day until finished.  4 tablet  0    Review of Systems  Constitutional: Negative for fever and chills.  Eyes:  Negative for blurred vision.  Respiratory: Negative for shortness of breath.   Cardiovascular: Negative for chest pain.  Gastrointestinal: Negative for nausea, vomiting, abdominal pain, diarrhea and constipation.  Genitourinary: Negative for dysuria, urgency and frequency.  Musculoskeletal: Negative for myalgias.  Neurological: Positive for dizziness. Negative for headaches.   Physical Exam   Blood pressure 99/35, pulse 82, temperature 98.3 F (36.8 C), temperature source Oral, resp. rate 20, height 5\' 3"  (1.6 m), weight 184 lb (83.462 kg), last menstrual period 08/27/2012, SpO2 100.00%.  Physical Exam  Nursing note and vitals reviewed. Constitutional: She is oriented to person, place, and time. She appears well-developed and well-nourished. No distress.  Cardiovascular: Normal rate.   Respiratory: Effort normal.  GI: Soft. She exhibits no distension. There is no tenderness.  Neurological: She is alert and oriented to person, place, and time.  Skin: Skin is warm.  Psychiatric: She has a normal mood and affect.    MAU Course  Procedures  Results for orders placed during the hospital encounter of 01/15/13 (from the past 24 hour(s))  URINALYSIS, ROUTINE W REFLEX MICROSCOPIC     Status: Abnormal   Collection Time    01/15/13  7:20 PM      Result Value Range   Color, Urine YELLOW  YELLOW   APPearance CLEAR  CLEAR   Specific Gravity, Urine 1.015  1.005 - 1.030   pH 5.5  5.0 - 8.0   Glucose, UA NEGATIVE  NEGATIVE mg/dL   Hgb urine dipstick NEGATIVE  NEGATIVE   Bilirubin Urine NEGATIVE  NEGATIVE   Ketones, ur NEGATIVE  NEGATIVE mg/dL   Protein, ur NEGATIVE  NEGATIVE mg/dL   Urobilinogen, UA 0.2  0.0 - 1.0 mg/dL   Nitrite POSITIVE (*) NEGATIVE   Leukocytes, UA MODERATE (*) NEGATIVE  URINE MICROSCOPIC-ADD ON     Status: Abnormal   Collection Time    01/15/13  7:20 PM      Result Value Range   Squamous Epithelial / LPF RARE  RARE   WBC, UA 3-6  <3 WBC/hpf   RBC / HPF 0-2  <3  RBC/hpf   Bacteria, UA FEW (*) RARE     Assessment and Plan   1. UTI (urinary tract infection) during pregnancy, second trimester   Likely reactive hypoglycemia 2/2 to not being able to eat at work and then eating a high sugar meal. Discussed good healthy eating in pregnancy with 6 small meals throughout the day. Note give for employer to increase break times.    RX Macrobid 2nd trimester danger signs reviewed Knows to return to MAU as needed Tawnya Crook 01/15/2013, 8:53 PM

## 2013-01-16 LAB — URINE CULTURE

## 2013-01-29 ENCOUNTER — Encounter: Payer: Self-pay | Admitting: Obstetrics & Gynecology

## 2013-01-29 ENCOUNTER — Encounter: Payer: Self-pay | Admitting: *Deleted

## 2013-01-29 ENCOUNTER — Ambulatory Visit (INDEPENDENT_AMBULATORY_CARE_PROVIDER_SITE_OTHER): Payer: Medicaid Other | Admitting: Obstetrics & Gynecology

## 2013-01-29 VITALS — BP 110/66 | Temp 98.1°F | Wt 187.6 lb

## 2013-01-29 DIAGNOSIS — O0930 Supervision of pregnancy with insufficient antenatal care, unspecified trimester: Secondary | ICD-10-CM | POA: Insufficient documentation

## 2013-01-29 DIAGNOSIS — Z3490 Encounter for supervision of normal pregnancy, unspecified, unspecified trimester: Secondary | ICD-10-CM

## 2013-01-29 DIAGNOSIS — O0932 Supervision of pregnancy with insufficient antenatal care, second trimester: Secondary | ICD-10-CM

## 2013-01-29 NOTE — Progress Notes (Signed)
P=67, Here for initial prenatal visit. States not able to take macrobid because they are too big to swallow. States has not had any mental health issues but Mother has PTSD and when she was pregnant Mom threw her out of home and she went to a therapist because of Mom and being thrown out. Just took medicine for Chlamydia last week. States having excruitiating pain in groin area - states hard to roll over. Given new patient information , discussed bmi and weight gain. Declines flu shot.

## 2013-01-29 NOTE — Progress Notes (Signed)
Pt was here for her new OB visit.  She mentioned her FMLA papers at the onset of the visit.  She "does not want to work until midnight because it's taking a toll on her body" and "nobody else pregnant at her job has to work until midnight."   After attempting to discuss the issue with the patient and explain to her that a visit and labs is needed and that no evaluation has been done.  Pt declined an exam stating that I "didn't care about her" and that she "wanted to find another practice because the other ladies at her job all have notes from their doctors" and "she didn't like the vibe I was giving her".  I referred her to the office manager.  She had her labs obtained after speaking to [facility].  Her visit was rescheduled. She was not taken out of work.

## 2013-01-29 NOTE — Patient Instructions (Addendum)
Gestational Diabetes Mellitus Gestational diabetes mellitus (GDM) is diabetes that occurs only during pregnancy. This happens when the body cannot properly handle the glucose (sugar) that increases in the blood after eating. During pregnancy, insulin resistance (reduced sensitivity to insulin) occurs because of the release of hormones from the placenta. Usually, the pancreas of pregnant women produces enough insulin to overcome the resistance that occurs. However, in gestational diabetes, the insulin is there but it does not work effectively. If the resistance is severe enough that the pancreas does not produce enough insulin, extra glucose builds up in the blood.  WHO IS AT RISK FOR DEVELOPING GESTATIONAL DIABETES?  Women with a history of diabetes in the family.  Women over age 25.  Women who are overweight.  Women in certain ethnic groups (Hispanic, African American, Native American, Asian and Pacific Islander). WHAT CAN HAPPEN TO THE BABY? If the mother's blood glucose is too high while she is pregnant, the extra sugar will travel through the umbilical cord to the baby. Some of the problems the baby may have are:  Large Baby - If the baby receives too much sugar, the baby will gain more weight. This may cause the baby to be too large to be born normally (vaginally) and a Cesarean section (C-section) may be needed.  Low Blood Glucose (hypoglycemia)  The baby makes extra insulin, in response to the extra sugar its gets from its mother. When the baby is born and no longer needs this extra insulin, the baby's blood glucose level may drop.  Jaundice (yellow coloring of the skin and eyes)  This is fairly common in babies. It is caused from a build-up of the chemical called bilirubin. This is rarely serious, but is seen more often in babies whose mothers had gestational diabetes. RISKS TO THE MOTHER Women who have had gestational diabetes may be at higher risk for some problems,  including:  Preeclampsia or toxemia, which includes problems with high blood pressure. Blood pressure and protein levels in the urine must be checked frequently.  Infections.  Cesarean section (C-section) for delivery.  Developing Type 2 diabetes later in life. About 30-50% will develop diabetes later, especially if obese. DIAGNOSIS  The hormones that cause insulin resistance are highest at about 24-28 weeks of pregnancy. If symptoms are experienced, they are much like symptoms you would normally expect during pregnancy.  GDM is often diagnosed using a two part method: 1. After 24-28 weeks of pregnancy, the woman drinks a glucose solution and takes a blood test. If the glucose level is high, a second test will be given. 2. Oral Glucose Tolerance Test (OGTT) which is 3 hours long  After not eating overnight, the blood glucose is checked. The woman drinks a glucose solution, and hourly blood glucose tests are taken. If the woman has risk factors for GDM, the caregiver may test earlier than 24 weeks of pregnancy. TREATMENT  Treatment of GDM is directed at keeping the mother's blood glucose level normal, and may include:  Meal planning.  Taking insulin or other medicine to control your blood glucose level.  Exercise.  Keeping a daily record of the foods you eat.  Blood glucose monitoring and keeping a record of your blood glucose levels.  May monitor ketone levels in the urine, although this is no longer considered necessary in most pregnancies. HOME CARE INSTRUCTIONS  While you are pregnant:  Follow your caregiver's advice regarding your prenatal appointments, meal planning, exercise, medicines, vitamins, blood and other tests, and physical   activities.  Keep a record of your meals, blood glucose tests, and the amount of insulin you are taking (if any). Show this to your caregiver at every prenatal visit.  If you have GDM, you may have problems with hypoglycemia (low blood glucose).  You may suspect this if you become suddenly dizzy, feel shaky, and/or weak. If you think this is happening and you have a glucose meter, try to test your blood glucose level. Follow your caregiver's advice for when and how to treat your low blood glucose. Generally, the 15:15 rule is followed: Treat by consuming 15 grams of carbohydrates, wait 15 minutes, and recheck blood glucose. Examples of 15 grams of carbohydrates are:  1 cup skim or low-fat milk.   cup juice.  3-4 glucose tablets.  5-6 hard candies.  1 small box raisins.   cup regular soda pop.  Practice good hygiene, to avoid infections.  Do not smoke. SEEK MEDICAL CARE IF:   You develop abnormal vaginal discharge, with or without itching.  You become weak and tired more than expected.  You seem to sweat a lot.  You have a sudden increase in weight, 5 pounds or more in one week.  You are losing weight, 3 pounds or more in a week.  Your blood glucose level is high, and you need instructions on what to do about it. SEEK IMMEDIATE MEDICAL CARE IF:   You develop a severe headache.  You faint or pass out.  You develop nausea and vomiting.  You become disoriented or confused.  You have a convulsion.  You develop vision problems.  You develop stomach pain.  You develop vaginal bleeding.  You develop uterine contractions.  You have leaking or a gush of fluid from the vagina. AFTER YOU HAVE THE BABY:  Go to all of your follow-up appointments, and have blood tests as advised by your caregiver.  Maintain a healthy lifestyle, to prevent diabetes in the future. This includes:  Following a healthy meal plan.  Controlling your weight.  Getting enough exercise and proper rest.  Do not smoke.  Breastfeed your baby if you can. This will lower the chance of you and your baby developing diabetes later in life. For more information about diabetes, go to the American Diabetes Association at:  www.americandiabetesassociation.org. For more information about gestational diabetes, go to the American Congress of Obstetricians and Gynecologists at: www.acog.org. Document Released: 01/01/2001 Document Revised: 12/18/2011 Document Reviewed: 07/26/2009 [employer] Patient Information 2013 [employer].  

## 2013-01-30 LAB — OBSTETRIC PANEL
Antibody Screen: NEGATIVE
Basophils Relative: 0 % (ref 0–1)
Eosinophils Relative: 1 % (ref 0–5)
HCT: 33.3 % — ABNORMAL LOW (ref 36.0–46.0)
Hemoglobin: 10.8 g/dL — ABNORMAL LOW (ref 12.0–15.0)
MCH: 27.8 pg (ref 26.0–34.0)
MCHC: 32.4 g/dL (ref 30.0–36.0)
MCV: 85.8 fL (ref 78.0–100.0)
Monocytes Absolute: 0.5 10*3/uL (ref 0.1–1.0)
Monocytes Relative: 5 % (ref 3–12)
Neutro Abs: 7.2 10*3/uL (ref 1.7–7.7)
Rh Type: POSITIVE
Rubella: 1.03 Index — ABNORMAL HIGH (ref <0.90)

## 2013-01-30 LAB — HIV ANTIBODY (ROUTINE TESTING W REFLEX): HIV: NONREACTIVE

## 2013-01-31 ENCOUNTER — Encounter: Payer: Self-pay | Admitting: Obstetrics & Gynecology

## 2013-01-31 LAB — HEMOGLOBINOPATHY EVALUATION: Hgb A2 Quant: 3.9 % — ABNORMAL HIGH (ref 2.2–3.2)

## 2013-02-03 ENCOUNTER — Encounter: Payer: Self-pay | Admitting: *Deleted

## 2013-02-19 ENCOUNTER — Inpatient Hospital Stay (HOSPITAL_COMMUNITY)
Admission: AD | Admit: 2013-02-19 | Discharge: 2013-02-19 | Disposition: A | Discharge status: Home / Self Care | Payer: Medicaid Other | Source: Ambulatory Visit | Attending: Obstetrics and Gynecology | Admitting: Obstetrics and Gynecology

## 2013-02-19 ENCOUNTER — Encounter (HOSPITAL_COMMUNITY): Payer: Self-pay | Admitting: *Deleted

## 2013-02-19 DIAGNOSIS — N949 Unspecified condition associated with female genital organs and menstrual cycle: Secondary | ICD-10-CM

## 2013-02-19 DIAGNOSIS — R109 Unspecified abdominal pain: Secondary | ICD-10-CM | POA: Insufficient documentation

## 2013-02-19 DIAGNOSIS — O2390 Unspecified genitourinary tract infection in pregnancy, unspecified trimester: Secondary | ICD-10-CM | POA: Insufficient documentation

## 2013-02-19 DIAGNOSIS — N390 Urinary tract infection, site not specified: Secondary | ICD-10-CM | POA: Insufficient documentation

## 2013-02-19 LAB — URINALYSIS, ROUTINE W REFLEX MICROSCOPIC
Glucose, UA: NEGATIVE mg/dL
Hgb urine dipstick: NEGATIVE
pH: 7.5 (ref 5.0–8.0)

## 2013-02-19 LAB — URINE MICROSCOPIC-ADD ON

## 2013-02-19 MED ORDER — CEPHALEXIN 250 MG/5ML PO SUSR: 250.0000 mg | Freq: Four times a day (QID) | ORAL | Status: AC

## 2013-02-19 NOTE — MAU Provider Note (Signed)
History    27 yo G2P1001 at [redacted]w[redacted]d presents with c/o sharp pain in lower abdomen and abdominal tightening.  Denies VB, LOF, recent fever, resp or GI c/o's, UTI or PIH s/s . GFM.     Chief Complaint  Patient presents with  . Abdominal Pain    OB History   Grav Para Term Preterm Abortions TAB SAB Ect Mult Living   2 1 1       1       Past Medical History  Diagnosis Date  . Acute bronchitis   . Unspecified adjustment reaction   . Unspecified asthma   . Obesity, unspecified   . Seasonal allergies   . Sickle cell trait   . Chlamydia infection     Past Surgical History  Procedure Laterality Date  . Wisdom tooth extraction    . Cesarean section      Family History  Problem Relation Age of Onset  . Diabetes Mother   . Hypertension Mother   . Hyperlipidemia Mother   . Transient ischemic attack Mother   . Diabetes Maternal Grandmother   . Hypertension Maternal Grandmother     History  Substance Use Topics  . Smoking status: Never Smoker   . Smokeless tobacco: Never Used  . Alcohol Use: No    Allergies:  Allergies  Allergen Reactions  . Penicillins Hives    Prescriptions prior to admission  Medication Sig Dispense Refill  . albuterol (VENTOLIN HFA) 108 (90 BASE) MCG/ACT inhaler Inhale 2 puffs into the lungs every 4 (four) hours as needed. For wheezing        ROS: see HPI above, all other systems are negative  Physical Exam   Last menstrual period 08/27/2012.  Chest: Clear Heart: RRR Abdomen: gravid, NT Extremities: WNL  No CVAT  Pelvic:  Dilation: Fingertip Effacement (%): Thick Cervical Position: Anterior Station: -3 Exam by:: J Zeppelin Commisso CNM  FHT: reactive for GA UCs: none detectable on toco  UA: Neg Urine Culture with sensitivities pending  ED Course  IUP at [redacted]w[redacted]d Round ligament pain + UTI at NOB interview  D/c home with precuations Keflex supp 250mg /78mL PO QID x 7 days Will call with results of urine culture and adjust abx as  needed   Haroldine Laws CNM, MSN 02/19/2013 7:25 PM

## 2013-02-19 NOTE — MAU Note (Signed)
Sharp pains in lower abd, and tightening.  Not like she had been having, started yesterday. Denies bleeding or d/c.

## 2013-02-26 ENCOUNTER — Encounter: Payer: Medicaid Other | Admitting: Advanced Practice Midwife

## 2013-03-05 ENCOUNTER — Inpatient Hospital Stay (HOSPITAL_COMMUNITY)
Admission: AD | Admit: 2013-03-05 | Discharge: 2013-03-05 | Disposition: A | Discharge status: Home / Self Care | Payer: Medicaid Other | Source: Ambulatory Visit | Attending: Obstetrics and Gynecology | Admitting: Obstetrics and Gynecology

## 2013-03-05 ENCOUNTER — Encounter (HOSPITAL_COMMUNITY): Payer: Self-pay | Admitting: *Deleted

## 2013-03-05 DIAGNOSIS — N949 Unspecified condition associated with female genital organs and menstrual cycle: Secondary | ICD-10-CM

## 2013-03-05 DIAGNOSIS — O99891 Other specified diseases and conditions complicating pregnancy: Secondary | ICD-10-CM | POA: Insufficient documentation

## 2013-03-05 DIAGNOSIS — O0932 Supervision of pregnancy with insufficient antenatal care, second trimester: Secondary | ICD-10-CM

## 2013-03-05 DIAGNOSIS — M5430 Sciatica, unspecified side: Secondary | ICD-10-CM | POA: Insufficient documentation

## 2013-03-05 DIAGNOSIS — R109 Unspecified abdominal pain: Secondary | ICD-10-CM | POA: Insufficient documentation

## 2013-03-05 HISTORY — DX: Urinary tract infection, site not specified: N39.0

## 2013-03-05 LAB — URINALYSIS, ROUTINE W REFLEX MICROSCOPIC
Bilirubin Urine: NEGATIVE
Ketones, ur: NEGATIVE mg/dL
Nitrite: NEGATIVE
Protein, ur: NEGATIVE mg/dL
Urobilinogen, UA: 0.2 mg/dL (ref 0.0–1.0)

## 2013-03-05 NOTE — MAU Note (Signed)
Patient states she has been having abdominal pain through the entire pregnancy but is getting worse and can no longer take it. Denies bleeding or unusual discharge or leaking. States she started having pain in her back and pelvis yesterday. Reports good fetal movement.

## 2013-03-05 NOTE — MAU Note (Signed)
Patient states she was diagnosed with a UTI in April but just started taking her medication today.

## 2013-03-05 NOTE — MAU Provider Note (Signed)
History   27 yo G2P1001 at [redacted]w[redacted]d presents unannounced with pelvic pain that she states is "inside the vagina and pelvic area" "feels like its in the bone" "sharp" States she needs help getting up out of bed, and she has trouble walking.  Denies N/V, VB, UCs, recent fever, resp or GI c/o's, UTI or PIH s/s. GFM. Today she picked up her abx rx written a UTI dx'd 2 months ago.   Pt will start the abx today.  Chief Complaint  Patient presents with  . Abdominal Pain    OB History   Grav Para Term Preterm Abortions TAB SAB Ect Mult Living   2 1 1       1       Past Medical History  Diagnosis Date  . Acute bronchitis   . Unspecified adjustment reaction   . Unspecified asthma(493.90)   . Obesity, unspecified   . Seasonal allergies   . Sickle cell trait   . Chlamydia infection   . Urinary tract infection     Past Surgical History  Procedure Laterality Date  . Wisdom tooth extraction    . Cesarean section      Family History  Problem Relation Age of Onset  . Diabetes Mother   . Hypertension Mother   . Hyperlipidemia Mother   . Transient ischemic attack Mother   . Diabetes Maternal Grandmother   . Hypertension Maternal Grandmother     History  Substance Use Topics  . Smoking status: Never Smoker   . Smokeless tobacco: Never Used  . Alcohol Use: No    Allergies:  Allergies  Allergen Reactions  . Penicillins Hives    Prescriptions prior to admission  Medication Sig Dispense Refill  . albuterol (VENTOLIN HFA) 108 (90 BASE) MCG/ACT inhaler Inhale 2 puffs into the lungs every 4 (four) hours as needed. For wheezing        ROS: see HPI above, all other systems are negative.   Physical Exam   Blood pressure 108/44, pulse 70, temperature 97.8 F (36.6 C), temperature source Oral, resp. rate 20, last menstrual period 08/27/2012, SpO2 100.00%.  Results for orders placed during the hospital encounter of 03/05/13 (from the past 24 hour(s))  URINALYSIS, ROUTINE W REFLEX  MICROSCOPIC     Status: Abnormal   Collection Time    03/05/13  1:00 PM      Result Value Range   Color, Urine YELLOW  YELLOW   APPearance CLEAR  CLEAR   Specific Gravity, Urine 1.025  1.005 - 1.030   pH 6.0  5.0 - 8.0   Glucose, UA NEGATIVE  NEGATIVE mg/dL   Hgb urine dipstick NEGATIVE  NEGATIVE   Bilirubin Urine NEGATIVE  NEGATIVE   Ketones, ur NEGATIVE  NEGATIVE mg/dL   Protein, ur NEGATIVE  NEGATIVE mg/dL   Urobilinogen, UA 0.2  0.0 - 1.0 mg/dL   Nitrite NEGATIVE  NEGATIVE   Leukocytes, UA MODERATE (*) NEGATIVE  URINE MICROSCOPIC-ADD ON     Status: Abnormal   Collection Time    03/05/13  1:00 PM      Result Value Range   Squamous Epithelial / LPF FEW (*) RARE   WBC, UA 0-2  <3 WBC/hpf   Bacteria, UA FEW (*) RARE    Chest: Clear Heart: RRR Abdomen: gravid, NT Extremities: WNL  Pelvic: Cl / TH / High No VB noted No cervical motion tenderness No tenderness at symphis pubis Vaginal wall pressure did not affect pain   FHT: Reactive NST  UCs: None traced  GC/CT culture collected Wet prep done on 5/15 - neg  ED Course  IUP at [redacted]w[redacted]d Suspected round ligament pain and sciatica  D/c home  F/u at the office on 6/5 scheduled office appt Will arrange a PT referral Will call pt with GC/CT results Continue abx treatment already rx'd   Haroldine Laws CNM, MSN 03/05/2013 2:46 PM

## 2013-03-06 LAB — URINE CULTURE: Colony Count: 45000

## 2013-03-07 LAB — GC/CHLAMYDIA PROBE AMP: CT Probe RNA: POSITIVE — AB

## 2013-03-08 ENCOUNTER — Inpatient Hospital Stay (HOSPITAL_COMMUNITY)
Admission: AD | Admit: 2013-03-08 | Discharge: 2013-03-08 | Disposition: A | Discharge status: Home / Self Care | Payer: Medicaid Other | Source: Ambulatory Visit | Attending: Obstetrics and Gynecology | Admitting: Obstetrics and Gynecology

## 2013-03-08 ENCOUNTER — Encounter (HOSPITAL_COMMUNITY): Payer: Self-pay | Admitting: Obstetrics and Gynecology

## 2013-03-08 DIAGNOSIS — A5619 Other chlamydial genitourinary infection: Secondary | ICD-10-CM | POA: Insufficient documentation

## 2013-03-08 DIAGNOSIS — N739 Female pelvic inflammatory disease, unspecified: Secondary | ICD-10-CM | POA: Insufficient documentation

## 2013-03-08 DIAGNOSIS — D573 Sickle-cell trait: Secondary | ICD-10-CM | POA: Diagnosis present

## 2013-03-08 DIAGNOSIS — O99891 Other specified diseases and conditions complicating pregnancy: Secondary | ICD-10-CM | POA: Insufficient documentation

## 2013-03-08 DIAGNOSIS — A749 Chlamydial infection, unspecified: Secondary | ICD-10-CM | POA: Diagnosis present

## 2013-03-08 DIAGNOSIS — N949 Unspecified condition associated with female genital organs and menstrual cycle: Secondary | ICD-10-CM | POA: Insufficient documentation

## 2013-03-08 DIAGNOSIS — M949 Disorder of cartilage, unspecified: Secondary | ICD-10-CM | POA: Insufficient documentation

## 2013-03-08 DIAGNOSIS — O98319 Other infections with a predominantly sexual mode of transmission complicating pregnancy, unspecified trimester: Secondary | ICD-10-CM | POA: Insufficient documentation

## 2013-03-08 DIAGNOSIS — O34219 Maternal care for unspecified type scar from previous cesarean delivery: Secondary | ICD-10-CM | POA: Diagnosis not present

## 2013-03-08 DIAGNOSIS — M899 Disorder of bone, unspecified: Secondary | ICD-10-CM

## 2013-03-08 DIAGNOSIS — Z880 Allergy status to penicillin: Secondary | ICD-10-CM

## 2013-03-08 DIAGNOSIS — O0932 Supervision of pregnancy with insufficient antenatal care, second trimester: Secondary | ICD-10-CM

## 2013-03-08 LAB — URINALYSIS, ROUTINE W REFLEX MICROSCOPIC
Glucose, UA: NEGATIVE mg/dL
Ketones, ur: NEGATIVE mg/dL
Nitrite: NEGATIVE
Protein, ur: NEGATIVE mg/dL
Urobilinogen, UA: 0.2 mg/dL (ref 0.0–1.0)

## 2013-03-08 LAB — URINE MICROSCOPIC-ADD ON

## 2013-03-08 MED ORDER — CYCLOBENZAPRINE HCL 10 MG PO TABS: 10.0000 mg | ORAL_TABLET | Freq: Three times a day (TID) | ORAL | Status: DC | PRN

## 2013-03-08 MED ORDER — OXYCODONE-ACETAMINOPHEN 10-325 MG PO TABS: 1.0000 | ORAL_TABLET | ORAL | Status: DC | PRN

## 2013-03-08 MED ORDER — AZITHROMYCIN 500 MG PO TABS: 1000.0000 mg | ORAL_TABLET | Freq: Once | ORAL | Status: DC

## 2013-03-08 NOTE — MAU Provider Note (Signed)
History   27 yo G2P1001 at 33 5/7 weeks presented after calling with continuing issues with pelvic/pubic bone pain, compromising her ability to walk and move.  Was crying on the phone, "so frustrated".  Seen in office for this issue and at MAU 5/14 and 5/28.  No cramping/contractions.  Pain is localized in pubic bone and is severe with any movement, walking, turning, getting up and down from sitting or lying down.  Referral to PT has been recommended, but has not yet occurred.  Denies leaking, bleeding, dysuria, or any other OB issues.  Hx C/S 14 months ago in [place], [place], for induction at 38 weeks, NRFHR.  Patient feels induction was unwarranted, since she wasn't in labor and was having no issues--she had presented to hospital for ? Contractions, but no cervical change had occurred--they "told me they were going to admit me since I was there".  Started on pitocin, progressed to 6 cm, but then had NRFHR and was taken to C/S.  Had postoperative wound infection, requiring I&D, PICC line for extended ATB therapy.  + chlamydia culture is noted from 5/28 visit--was treated in 10/2012, but thinks she didn't take the medication correctly (took Azithromycin dose over 4 days, not at one dose.  Partner was treated in January.) UA negative on 5/28, with negative urine culture that visit.  Patient Active Problem List   Diagnosis Date Noted  . Previous cesarean delivery, antepartum condition or complication 03/09/2013  . Possible diastasis of pubic symphysis  03/09/2013  . Chlamydia infection complicating pregnancy 03/09/2013  . Sickle cell trait 03/09/2013  . Allergy to penicillin 03/09/2013  . Late prenatal care complicating pregnancy 01/29/2013  . Pregnancy, supervision of normal 05/01/2011  . OBESITY 12/26/2010  . ADJUSTMENT DISORDER 12/26/2010  . ASTHMA 12/26/2010     Chief Complaint  Patient presents with  . Pelvic Pain     OB History   Grav Para Term Preterm Abortions TAB SAB Ect Mult Living    2 1 1       1       Past Medical History  Diagnosis Date  . Acute bronchitis   . Unspecified adjustment reaction   . Unspecified asthma(493.90)   . Obesity, unspecified   . Seasonal allergies   . Sickle cell trait   . Chlamydia infection   . Urinary tract infection     Past Surgical History  Procedure Laterality Date  . Wisdom tooth extraction    . Cesarean section      Family History  Problem Relation Age of Onset  . Diabetes Mother   . Hypertension Mother   . Hyperlipidemia Mother   . Transient ischemic attack Mother   . Diabetes Maternal Grandmother   . Hypertension Maternal Grandmother     History  Substance Use Topics  . Smoking status: Never Smoker   . Smokeless tobacco: Never Used  . Alcohol Use: No    Allergies:  Allergies  Allergen Reactions  . Penicillins Hives    No prescriptions prior to admission     Physical Exam   Blood pressure 119/54, pulse 71, temperature 98 F (36.7 C), temperature source Oral, resp. rate 18, last menstrual period 08/27/2012.  In mild discomfort at rest, but has difficulty moving and changing positions due to pain in pubic bone. Chest clear Heart RRR without murmur Abd gravid, NT.  Positive tenderness over pubic symphysis, with elicitation of severe pain with pressure on that area. Pelvic--deferred Ext WNL Back--no CVAT  FHR Category 1  No contractions  Results for orders placed during the hospital encounter of 03/08/13 (from the past 24 hour(s))  URINALYSIS, ROUTINE W REFLEX MICROSCOPIC     Status: Abnormal   Collection Time    03/08/13  2:45 PM      Result Value Range   Color, Urine YELLOW  YELLOW   APPearance CLEAR  CLEAR   Specific Gravity, Urine 1.015  1.005 - 1.030   pH 7.0  5.0 - 8.0   Glucose, UA NEGATIVE  NEGATIVE mg/dL   Hgb urine dipstick NEGATIVE  NEGATIVE   Bilirubin Urine NEGATIVE  NEGATIVE   Ketones, ur NEGATIVE  NEGATIVE mg/dL   Protein, ur NEGATIVE  NEGATIVE mg/dL   Urobilinogen, UA  0.2  0.0 - 1.0 mg/dL   Nitrite NEGATIVE  NEGATIVE   Leukocytes, UA MODERATE (*) NEGATIVE  URINE MICROSCOPIC-ADD ON     Status: Abnormal   Collection Time    03/08/13  2:45 PM      Result Value Range   Squamous Epithelial / LPF FEW (*) RARE   WBC, UA 3-6  <3 WBC/hpf   RBC / HPF 0-2  <3 RBC/hpf   Bacteria, UA MANY (*) RARE   Urine to culture again today  ED Course  IUP at 27 5/7 weeks Possible separation of pubic symphysis + chlamydia  Plan: Long discussion with patient regarding pubic bone pain and associated issues.  Support offered for her issues and concerns. Rx Flexeril and Vicodin for use Rx Azithromycin 1000 mg for treatment of chlamydia--Rx called to patient's pharmacy for her partner, Abigail Miyamoto (DOB 05/02/85), with TC note in his EPIC chart reflecting this issue and plan. Plan referral to PT ASAP. Note for work for today, and note to request patient be able to work daytime hours per her request.    Nigel Bridgeman CNM, [place] 03/09/2013 9:16 AM

## 2013-03-08 NOTE — MAU Note (Signed)
Pt reports increased pelvic pain. Has been having in for several weeks has been seen in office but not found anything that causing pain. Good fetal movement reported.

## 2013-03-09 DIAGNOSIS — A749 Chlamydial infection, unspecified: Secondary | ICD-10-CM | POA: Diagnosis present

## 2013-03-09 DIAGNOSIS — Z880 Allergy status to penicillin: Secondary | ICD-10-CM

## 2013-03-09 DIAGNOSIS — O34219 Maternal care for unspecified type scar from previous cesarean delivery: Secondary | ICD-10-CM | POA: Diagnosis not present

## 2013-03-09 DIAGNOSIS — D573 Sickle-cell trait: Secondary | ICD-10-CM | POA: Diagnosis present

## 2013-03-09 LAB — URINE CULTURE: Colony Count: 35000

## 2013-03-27 ENCOUNTER — Inpatient Hospital Stay (HOSPITAL_COMMUNITY)
Admission: AD | Admit: 2013-03-27 | Discharge: 2013-03-27 | Disposition: A | Discharge status: Home / Self Care | Payer: Medicaid Other | Source: Ambulatory Visit | Attending: Obstetrics and Gynecology | Admitting: Obstetrics and Gynecology

## 2013-03-27 ENCOUNTER — Inpatient Hospital Stay (HOSPITAL_COMMUNITY): Payer: Medicaid Other

## 2013-03-27 ENCOUNTER — Encounter (HOSPITAL_COMMUNITY): Payer: Self-pay | Admitting: *Deleted

## 2013-03-27 DIAGNOSIS — O4700 False labor before 37 completed weeks of gestation, unspecified trimester: Secondary | ICD-10-CM | POA: Insufficient documentation

## 2013-03-27 DIAGNOSIS — O368190 Decreased fetal movements, unspecified trimester, not applicable or unspecified: Secondary | ICD-10-CM | POA: Insufficient documentation

## 2013-03-27 DIAGNOSIS — O368130 Decreased fetal movements, third trimester, not applicable or unspecified: Secondary | ICD-10-CM

## 2013-03-27 DIAGNOSIS — O0932 Supervision of pregnancy with insufficient antenatal care, second trimester: Secondary | ICD-10-CM

## 2013-03-27 DIAGNOSIS — O368390 Maternal care for abnormalities of the fetal heart rate or rhythm, unspecified trimester, not applicable or unspecified: Secondary | ICD-10-CM | POA: Insufficient documentation

## 2013-03-27 DIAGNOSIS — O34219 Maternal care for unspecified type scar from previous cesarean delivery: Secondary | ICD-10-CM | POA: Diagnosis present

## 2013-03-27 LAB — FETAL FIBRONECTIN: Fetal Fibronectin: POSITIVE — AB

## 2013-03-27 NOTE — MAU Provider Note (Signed)
History   27yo, G2P1001 at [redacted]w[redacted]d was sent over from the office for a non-reassuring NST and some UCs on the monitor.  Dr. Pennie Rushing asked for a fFN and a BPP. Denies VB, UCs, LOF, recent fever, resp or GI c/o's, UTI or PIH s/s.  Was seen in the office for decreased FM x 2 weeks.  Chief Complaint  Patient presents with  . Decreased Fetal Movement    OB History   Grav Para Term Preterm Abortions TAB SAB Ect Mult Living   2 1 1       1       Past Medical History  Diagnosis Date  . Acute bronchitis   . Unspecified adjustment reaction   . Unspecified asthma(493.90)   . Obesity, unspecified   . Seasonal allergies   . Sickle cell trait   . Chlamydia infection   . Urinary tract infection     Past Surgical History  Procedure Laterality Date  . Wisdom tooth extraction    . Cesarean section      Family History  Problem Relation Age of Onset  . Diabetes Mother   . Hypertension Mother   . Hyperlipidemia Mother   . Transient ischemic attack Mother   . Diabetes Maternal Grandmother   . Hypertension Maternal Grandmother     History  Substance Use Topics  . Smoking status: Never Smoker   . Smokeless tobacco: Never Used  . Alcohol Use: No    Allergies:  Allergies  Allergen Reactions  . Penicillins Hives    Prescriptions prior to admission  Medication Sig Dispense Refill  . albuterol (VENTOLIN HFA) 108 (90 BASE) MCG/ACT inhaler Inhale 2 puffs into the lungs every 4 (four) hours as needed. For wheezing      . azithromycin (ZITHROMAX) 500 MG tablet Take 2 tablets (1,000 mg total) by mouth once.  2 tablet  0  . cyclobenzaprine (FLEXERIL) 10 MG tablet Take 1 tablet (10 mg total) by mouth 3 (three) times daily as needed for muscle spasms.  36 tablet  2  . oxyCODONE-acetaminophen (PERCOCET) 10-325 MG per tablet Take 1 tablet by mouth every 4 (four) hours as needed for pain.  36 tablet  0    ROS: see HPI above, all other systems are negative   Physical Exam   Blood pressure  115/57, pulse 64, temperature 98.3 F (36.8 C), temperature source Oral, resp. rate 16, height 5' 3.5" (1.613 m), weight 198 lb (89.812 kg), last menstrual period 08/27/2012.  Chest: Clear Heart: RRR Abdomen: gravid, NT Extremities: WNL    ED Course  IUP at [redacted]w[redacted]d Decreased FM  Reported off to on-coming CNM  Haroldine Laws CNM, MSN 03/27/2013 6:45 PM

## 2013-03-27 NOTE — Progress Notes (Signed)
S:  Pt denies ctxs.  Reports IC in the last 24 hrs.  Feels nauseated, especially in supine positions.  GFM since in MAU.  S.o. And 22mo old at [facility].  States she was "off" today and ran a lot of errands.  Inadequate water intake as well.  Since in MAU has noted some "white spots" in line of vision.    O:  .Marland Kitchen Filed Vitals:   03/27/13 1833 03/27/13 1840  BP:  115/57  Pulse:  64  Temp:  98.3 F (36.8 C)  TempSrc:  Oral  Resp:  16  Height: 5' 3.5" (1.613 m)   Weight: 198 lb (89.812 kg)   FHR: 150, reactive, occ'l mild variable; no ctxs or UI SVE: closed/long/high/ medium firmness .Marland Kitchen Results for orders placed during the hospital encounter of 03/27/13 (from the past 24 hour(s))  FETAL FIBRONECTIN     Status: Abnormal   Collection Time    03/27/13  6:50 PM      Result Value Range   Fetal Fibronectin POSITIVE (*) NEGATIVE   U/s: vtx, FHR 144, 8/8 BPP  A:  1. [redacted]w[redacted]d       2. Cat I FHT       3. No s/s of PTL       4. FFN pos, but sent w/o asking if anything in vagina in last 24 hrs      5.  Previous c/s (unsure if desires repeat or TOLAC)  P:  D/c'd home w/ FKC and PTL precautions     F/u as scheduled in 2 weeks at [facility], or prn  C. Denny Levy, CNM

## 2013-03-27 NOTE — MAU Note (Signed)
Patient states she has been having decrease in the fetal movement and was seen in the office today for a regular appointment. Was put on the monitor and had some drops in the fetal heart and sent to MAU for evaluation. Patient denies pain, bleeding or leaking.

## 2013-03-29 ENCOUNTER — Inpatient Hospital Stay (HOSPITAL_COMMUNITY)
Admission: AD | Admit: 2013-03-29 | Discharge: 2013-03-29 | Disposition: A | Discharge status: Home / Self Care | Payer: Medicaid Other | Source: Ambulatory Visit | Attending: Obstetrics and Gynecology | Admitting: Obstetrics and Gynecology

## 2013-03-29 ENCOUNTER — Encounter: Payer: Self-pay | Admitting: Obstetrics and Gynecology

## 2013-03-29 ENCOUNTER — Inpatient Hospital Stay (HOSPITAL_COMMUNITY): Payer: Medicaid Other

## 2013-03-29 ENCOUNTER — Encounter (HOSPITAL_COMMUNITY): Payer: Self-pay | Admitting: Obstetrics and Gynecology

## 2013-03-29 DIAGNOSIS — O368390 Maternal care for abnormalities of the fetal heart rate or rhythm, unspecified trimester, not applicable or unspecified: Secondary | ICD-10-CM | POA: Insufficient documentation

## 2013-03-29 DIAGNOSIS — O0932 Supervision of pregnancy with insufficient antenatal care, second trimester: Secondary | ICD-10-CM

## 2013-03-29 DIAGNOSIS — O4700 False labor before 37 completed weeks of gestation, unspecified trimester: Secondary | ICD-10-CM | POA: Insufficient documentation

## 2013-03-29 LAB — WET PREP, GENITAL

## 2013-03-29 MED ORDER — TERCONAZOLE 0.4 % VA CREA: 1.0000 | TOPICAL_CREAM | Freq: Every day | VAGINAL | Status: DC

## 2013-03-29 MED ORDER — METRONIDAZOLE 500 MG PO TABS: 500.0000 mg | ORAL_TABLET | Freq: Two times a day (BID) | ORAL | Status: DC

## 2013-03-29 NOTE — MAU Note (Signed)
Ms. Vittitow is here today with complaints of abdominal cramping. The cramping started last night has progressively gotten worse. She is concerned because she was here the other day and had a FFN that was positive.

## 2013-03-29 NOTE — MAU Provider Note (Signed)
History     CSN: [id number]  Arrival date and time: 03/29/13 1312   First Provider Initiated Contact with Patient 03/29/13 1430      Chief Complaint  Patient presents with  . Contractions   HPI Comments: Pt is a G2P1 at [redacted]w[redacted]d that arrives to MAU after CNM from work twice today with multiple vague complaints. 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 Admitted to not eating or drinking anything when she first called me around noon, called back about 2 hours later and stated she had lunch and had been drinking water, but pain was still there (crying intermittently on phone).  States pain is mainly in her lower abdomen but wraps around her hips and tops of legs, as well and back pain that is all pretty much constant. Denies any ctx, no VB or LOF. No change in discharge but does admit to frequent whitish non-odorous discharge. States she's not had IC since Wednesday.  Pt was here 6-19 and had a +FFN, but determined to be false positive because of recent IC. She also had a BPP that was 8/8.  After exam, pt also informed me had +chlamydia and didn't think she took ABX correctly, was tx'd a 2nd time w 1g zithromax, as well as partner, on 03/09/13.     Past Medical History  Diagnosis Date  . Acute bronchitis   . Unspecified adjustment reaction   . Unspecified asthma(493.90)   . Obesity, unspecified   . Seasonal allergies   . Sickle cell trait   . Chlamydia infection   . Urinary tract infection     Past Surgical History  Procedure Laterality Date  . Wisdom tooth extraction    . Cesarean section      Family History  Problem Relation Age of Onset  . Diabetes Mother   . Hypertension Mother   . Hyperlipidemia Mother   . Transient ischemic attack Mother   . Diabetes Maternal Grandmother   . Hypertension Maternal Grandmother     History   Substance Use Topics  . Smoking status: Never Smoker   . Smokeless tobacco: Never Used  . Alcohol Use: No    Allergies:  Allergies  Allergen Reactions  . Penicillins Hives    Prescriptions prior to admission  Medication Sig Dispense Refill  . cyclobenzaprine (FLEXERIL) 10 MG tablet Take 1 tablet (10 mg total) by mouth 3 (three) times daily as needed for muscle spasms.  36 tablet  2  . oxyCODONE-acetaminophen (PERCOCET) 10-325 MG per tablet Take 1 tablet by mouth every 4 (four) hours as needed for pain.  36 tablet  0  . albuterol (VENTOLIN HFA) 108 (90 BASE) MCG/ACT inhaler Inhale 2 puffs into the lungs every 4 (four) hours as needed. For wheezing        Review of Systems  Gastrointestinal: Negative for nausea and vomiting.  Genitourinary: Negative for dysuria.       White discharge  Musculoskeletal: Positive for back pain and joint pain.       SPD   All other systems reviewed and are negative.   Physical Exam   Blood pressure 119/54, pulse 79, temperature 98.6 F (37 C), temperature source Oral, resp. rate 18, last menstrual period 08/27/2012.  Physical Exam  Nursing note and vitals reviewed. Constitutional: She is oriented to person, place, and time. She  appears well-developed and well-nourished.  HENT:  Head: Normocephalic.  Eyes: Pupils are equal, round, and reactive to light.  Neck: Normal range of motion.  Cardiovascular: Normal rate, regular rhythm and normal heart sounds.   Respiratory: Effort normal and breath sounds normal.  GI: Soft. Bowel sounds are normal. She exhibits no distension. There is no tenderness. There is no rebound.  Genitourinary: Vaginal discharge found.  Mod amt thick white discharge in vault, pt did not tolerate speculum exam complete insertion,  Cervix ext os 2cm but thick and closed internally, no PP in pelvis   Musculoskeletal: Normal range of motion.  Neurological: She is alert and oriented to person, place, and time. She has normal  reflexes.  Skin: Skin is warm and dry.  Psychiatric: She has a normal mood and affect. Her behavior is normal.   FHR noted to have occ mild variables, and then 1 likely prolonged decel, (tracing lost contact, but RN went in room to adjust EFM and heard decel) toco quiet   MAU Course  Procedures    Assessment and Plan  IUP at [redacted]w[redacted]d SPD FHR decels  Wet prep - +clue, +yeast Will send dirty UA for TOC chlamydia Will send for complete OB US, to check growth and AFI and BPP.  C/w Dr Raymondo Band 03/29/2013, 2:42 PM   Addendum at 1745  Final report not back from [place], but preliminary report shows:  Cephalic position  AFI=17.0  EFW 1495g - 21%   BPP 8/8   Rx for flagyl Rx for terazol rv'd hygiene measures, no intercourse rv'd comfort measures of SPD and to keep PT appt  rv'd FKC and PTL Pt to keep f/u appointment at office for next week.  Dr Dion Body updated  S.Alexanderjames Berg, CNM    Addendum: review of final report of [place]  HC - 10% AC - 21% FL 32%  EFW 1495g - 43% Normal AFI Otherwise normal findings  S.Dellie Piasecki, CNM

## 2013-04-03 ENCOUNTER — Ambulatory Visit: Payer: Medicaid Other | Admitting: Physical Therapy

## 2013-04-10 ENCOUNTER — Ambulatory Visit: Payer: Medicaid Other | Attending: Obstetrics and Gynecology | Admitting: Physical Therapy

## 2013-04-10 DIAGNOSIS — O9989 Other specified diseases and conditions complicating pregnancy, childbirth and the puerperium: Secondary | ICD-10-CM | POA: Insufficient documentation

## 2013-04-10 DIAGNOSIS — M629 Disorder of muscle, unspecified: Secondary | ICD-10-CM | POA: Insufficient documentation

## 2013-04-10 DIAGNOSIS — N949 Unspecified condition associated with female genital organs and menstrual cycle: Secondary | ICD-10-CM | POA: Insufficient documentation

## 2013-04-10 DIAGNOSIS — IMO0001 Reserved for inherently not codable concepts without codable children: Secondary | ICD-10-CM | POA: Insufficient documentation

## 2013-04-10 DIAGNOSIS — M2420 Disorder of ligament, unspecified site: Secondary | ICD-10-CM | POA: Insufficient documentation

## 2013-05-05 ENCOUNTER — Encounter (HOSPITAL_COMMUNITY): Payer: Self-pay | Admitting: *Deleted

## 2013-05-05 ENCOUNTER — Inpatient Hospital Stay (HOSPITAL_COMMUNITY)
Admission: AD | Admit: 2013-05-05 | Discharge: 2013-05-05 | Disposition: A | Discharge status: Home / Self Care | Payer: Medicaid Other | Source: Ambulatory Visit | Attending: Obstetrics and Gynecology | Admitting: Obstetrics and Gynecology

## 2013-05-05 DIAGNOSIS — O99891 Other specified diseases and conditions complicating pregnancy: Secondary | ICD-10-CM | POA: Insufficient documentation

## 2013-05-05 DIAGNOSIS — O4700 False labor before 37 completed weeks of gestation, unspecified trimester: Secondary | ICD-10-CM | POA: Insufficient documentation

## 2013-05-05 LAB — OB RESULTS CONSOLE GC/CHLAMYDIA: Gonorrhea: NEGATIVE

## 2013-05-05 LAB — WET PREP, GENITAL: Yeast Wet Prep HPF POC: NONE SEEN

## 2013-05-05 NOTE — Discharge Instructions (Signed)

## 2013-05-05 NOTE — MAU Note (Signed)
?   Leaking since Saturday, is constantly wet.  Denies bleeding.  uc's since Tuesday, seen in MD office today.

## 2013-05-06 LAB — GC/CHLAMYDIA PROBE AMP
CT Probe RNA: NEGATIVE
GC Probe RNA: NEGATIVE

## 2013-05-17 ENCOUNTER — Inpatient Hospital Stay (HOSPITAL_COMMUNITY)
Admission: AD | Admit: 2013-05-17 | Discharge: 2013-05-17 | Disposition: A | Discharge status: Home / Self Care | Payer: Medicaid Other | Source: Ambulatory Visit | Attending: Obstetrics and Gynecology | Admitting: Obstetrics and Gynecology

## 2013-05-17 ENCOUNTER — Encounter (HOSPITAL_COMMUNITY): Payer: Self-pay | Admitting: Emergency Medicine

## 2013-05-17 DIAGNOSIS — O0932 Supervision of pregnancy with insufficient antenatal care, second trimester: Secondary | ICD-10-CM

## 2013-05-17 DIAGNOSIS — O479 False labor, unspecified: Secondary | ICD-10-CM | POA: Insufficient documentation

## 2013-05-17 LAB — OB RESULTS CONSOLE GBS: GBS: POSITIVE

## 2013-05-17 NOTE — Progress Notes (Signed)
Report called to [facility] in [place]. BS will do labor eval on pt. Aware of prior C/S and desire to [facility]

## 2013-05-17 NOTE — MAU Provider Note (Signed)
MAU NOTE: (pt sent to L&D Rm 162 as MAU overflow)  S:  Joanna Padilla is a 27y.o. BF G2P1001 at [redacted]w[redacted]d who presents for a labor check w/ cc of irregular ctxs since this morning, but became more painful and "almost back to back," around 1600.  Pt desires TOLAC; previous c/s in [place] for NRFHT (was induced around 36-37 weeks per pt recall).  Pt reports told at time of c/s baby had nuchal cord x3. She is accompanied by her s.o. And her 53mo daughter.  She denies UTI or PIH s/s.  N/v earlier today.  One glass of water total for the day and some juice otherwise.  BM regular.  No fever or chills.  Denies LOF or VB.  Cx hasn't been checked recently, but one cm.  GBS hasn't been obtained at office.  Pt states she lives off [address]. .. Patient Active Problem List   Diagnosis Date Noted  . History of cesarean delivery, currently pregnant 03/27/2013  . Previous cesarean delivery, antepartum condition or complication 03/09/2013  . Possible diastasis of pubic symphysis  03/09/2013  . Chlamydia infection complicating pregnancy 03/09/2013  . Sickle cell trait 03/09/2013  . Allergy to penicillin 03/09/2013  . Late prenatal care complicating pregnancy 01/29/2013  . Pregnancy, supervision of normal 05/01/2011  . OBESITY 12/26/2010  . ADJUSTMENT DISORDER 12/26/2010  . ASTHMA 12/26/2010  .Marland Kitchen Past Medical History  Diagnosis Date  . Acute bronchitis   . Unspecified asthma(493.90)   . Obesity, unspecified   . Seasonal allergies   . Sickle cell trait   . Chlamydia infection   . Urinary tract infection   Current Meds: MDI prn  O:  EFM: 130, reactive, no decels, mod variability TOCO: irregular ctxs: q2-8, mild to mod and differing intensity to pt as well; rest palpated  PE:  Gen: tense and grimace w/ some ctxs, but no labored breathing; A&Ox3          Abd: soft, NT gravid         Cx: ext os 3cm/int os 1.5/50/-3 to -2, vtx, intact         Ext: WNL, DTRs brisk .Marland Kitchen Results for orders placed during the hospital  encounter of 05/17/13 (from the past 24 hour(s))  GROUP B STREP BY PCR     Status: Abnormal   Collection Time    05/17/13  8:18 PM      Result Value Range   Group B strep by PCR POSITIVE (*) NEGATIVE   A: 1. [redacted]w[redacted]d      2. No cervical change       3. Irregular ctxs      4. Threatened labor at term      5. Desires TOLAC      6. GBS pos     7. PCN allergy     8. Inadequate po hydration  P:  1. D/c'd home w/ labor precautions and FKC       2. Has Flexeril at home and enc'd to take at dose when gets home      3. Adequate water intake rev'd      4. recommended IV bolus and offered po or IV pain meds, and pt declined; drank one pink pitcher of water while          observed and voided 3-4x afterwards     5. F/u 05/22/13 at [facility] as scheduled, or prn  NOTE:  Sent routine GBS culture to lab as well for C&S secondary to  PCN allergy, as well as PCR  C. Denny Levy, [place] 05/17/13 2220

## 2013-05-17 NOTE — Progress Notes (Signed)
Plan for po hydration and H.Steelman,CNM will recheck cervix in 2 hrs.

## 2013-05-17 NOTE — MAU Note (Signed)
Contractions since 1600. Some clear d/c. Prior c/s. Wants to [facility]

## 2013-05-22 LAB — CULTURE, BETA STREP (GROUP B ONLY)

## 2013-05-27 ENCOUNTER — Inpatient Hospital Stay (HOSPITAL_COMMUNITY): Payer: Medicaid Other | Admitting: Anesthesiology

## 2013-05-27 ENCOUNTER — Inpatient Hospital Stay (HOSPITAL_COMMUNITY)
Admission: AD | Admit: 2013-05-27 | Discharge: 2013-05-29 | DRG: 775 | Disposition: A | Discharge status: Home / Self Care | Payer: Medicaid Other | Source: Ambulatory Visit | Attending: Obstetrics and Gynecology | Admitting: Obstetrics and Gynecology

## 2013-05-27 ENCOUNTER — Encounter (HOSPITAL_COMMUNITY): Payer: Self-pay

## 2013-05-27 ENCOUNTER — Encounter (HOSPITAL_COMMUNITY): Payer: Self-pay | Admitting: Anesthesiology

## 2013-05-27 DIAGNOSIS — O34219 Maternal care for unspecified type scar from previous cesarean delivery: Secondary | ICD-10-CM | POA: Diagnosis present

## 2013-05-27 DIAGNOSIS — O9902 Anemia complicating childbirth: Secondary | ICD-10-CM | POA: Diagnosis present

## 2013-05-27 DIAGNOSIS — O368190 Decreased fetal movements, unspecified trimester, not applicable or unspecified: Secondary | ICD-10-CM | POA: Diagnosis present

## 2013-05-27 DIAGNOSIS — O4290 Premature rupture of membranes, unspecified as to length of time between rupture and onset of labor, unspecified weeks of gestation: Principal | ICD-10-CM | POA: Diagnosis present

## 2013-05-27 DIAGNOSIS — R87612 Low grade squamous intraepithelial lesion on cytologic smear of cervix (LGSIL): Secondary | ICD-10-CM | POA: Insufficient documentation

## 2013-05-27 DIAGNOSIS — O26849 Uterine size-date discrepancy, unspecified trimester: Secondary | ICD-10-CM | POA: Insufficient documentation

## 2013-05-27 DIAGNOSIS — D573 Sickle-cell trait: Secondary | ICD-10-CM | POA: Diagnosis present

## 2013-05-27 DIAGNOSIS — Z22330 Carrier of Group B streptococcus: Secondary | ICD-10-CM

## 2013-05-27 DIAGNOSIS — O0932 Supervision of pregnancy with insufficient antenatal care, second trimester: Secondary | ICD-10-CM

## 2013-05-27 DIAGNOSIS — O99892 Other specified diseases and conditions complicating childbirth: Secondary | ICD-10-CM | POA: Diagnosis present

## 2013-05-27 LAB — CBC
HCT: 34.9 % — ABNORMAL LOW (ref 36.0–46.0)
HCT: 33.2 % — ABNORMAL LOW (ref 36.0–46.0)
Hemoglobin: 12.3 g/dL (ref 12.0–15.0)
Hemoglobin: 13.2 g/dL (ref 12.0–15.0)
Hemoglobin: 11.8 g/dL — ABNORMAL LOW (ref 12.0–15.0)
MCHC: 35.2 g/dL (ref 30.0–36.0)
MCV: 82.4 fL (ref 78.0–100.0)
Platelets: 319 10*3/uL (ref 150–400)
RBC: 4.22 MIL/uL (ref 3.87–5.11)
RBC: 4.56 MIL/uL (ref 3.87–5.11)
RBC: 4.03 MIL/uL (ref 3.87–5.11)
RDW: 12.4 % (ref 11.5–15.5)
WBC: 23.4 10*3/uL — ABNORMAL HIGH (ref 4.0–10.5)
WBC: 23.4 10*3/uL — ABNORMAL HIGH (ref 4.0–10.5)

## 2013-05-27 LAB — TYPE AND SCREEN: Antibody Screen: NEGATIVE

## 2013-05-27 LAB — AMNISURE RUPTURE OF MEMBRANE (ROM) NOT AT ARMC: Amnisure ROM: POSITIVE

## 2013-05-27 MED ORDER — LACTATED RINGERS IV SOLN: 500.0000 mL | INTRAVENOUS | Status: DC | PRN

## 2013-05-27 MED ORDER — PHENYLEPHRINE 40 MCG/ML (10ML) SYRINGE FOR IV PUSH (FOR BLOOD PRESSURE SUPPORT): 80.0000 ug | PREFILLED_SYRINGE | INTRAVENOUS | Status: DC | PRN

## 2013-05-27 MED ORDER — LACTATED RINGERS IV SOLN
INTRAVENOUS | Status: DC
  Administered 2013-05-27: 14:00:00 via INTRAVENOUS

## 2013-05-27 MED ORDER — OXYTOCIN BOLUS FROM INFUSION
500.0000 mL | INTRAVENOUS | Status: DC
  Administered 2013-05-27: 500 mL via INTRAVENOUS

## 2013-05-27 MED ORDER — OXYCODONE-ACETAMINOPHEN 5-325 MG PO TABS: 1.0000 | ORAL_TABLET | ORAL | Status: DC | PRN

## 2013-05-27 MED ORDER — IBUPROFEN 100 MG/5ML PO SUSP
600.0000 mg | Freq: Four times a day (QID) | ORAL | Status: DC | PRN
  Filled 2013-05-27: qty 30

## 2013-05-27 MED ORDER — ACETAMINOPHEN 325 MG PO TABS: 650.0000 mg | ORAL_TABLET | ORAL | Status: DC | PRN

## 2013-05-27 MED ORDER — ONDANSETRON HCL 4 MG/2ML IJ SOLN: 4.0000 mg | Freq: Four times a day (QID) | INTRAMUSCULAR | Status: DC | PRN

## 2013-05-27 MED ORDER — DIPHENHYDRAMINE HCL 12.5 MG/5ML PO ELIX: 12.5000 mg | ORAL_SOLUTION | Freq: Four times a day (QID) | ORAL | Status: DC | PRN

## 2013-05-27 MED ORDER — LIDOCAINE HCL (PF) 1 % IJ SOLN
30.0000 mL | INTRAMUSCULAR | Status: AC | PRN
  Administered 2013-05-27: 30 mL via SUBCUTANEOUS
  Filled 2013-05-27 (×3): qty 30

## 2013-05-27 MED ORDER — TERBUTALINE SULFATE 1 MG/ML IJ SOLN: 0.2500 mg | Freq: Once | INTRAMUSCULAR | Status: DC | PRN

## 2013-05-27 MED ORDER — PHENYLEPHRINE 40 MCG/ML (10ML) SYRINGE FOR IV PUSH (FOR BLOOD PRESSURE SUPPORT)
80.0000 ug | PREFILLED_SYRINGE | INTRAVENOUS | Status: DC | PRN
  Filled 2013-05-27: qty 5

## 2013-05-27 MED ORDER — DIPHENHYDRAMINE HCL 50 MG/ML IJ SOLN
25.0000 mg | Freq: Four times a day (QID) | INTRAMUSCULAR | Status: DC | PRN
  Administered 2013-05-27: 25 mg via INTRAVENOUS

## 2013-05-27 MED ORDER — SODIUM CHLORIDE 0.9 % IJ SOLN: 9.0000 mL | INTRAMUSCULAR | Status: DC | PRN

## 2013-05-27 MED ORDER — SODIUM BICARBONATE 8.4 % IV SOLN: INTRAVENOUS | Status: DC | PRN

## 2013-05-27 MED ORDER — ACETAMINOPHEN 160 MG/5ML PO SOLN
325.0000 mg | ORAL | Status: DC | PRN
  Filled 2013-05-27: qty 10.2

## 2013-05-27 MED ORDER — HYDROMORPHONE 0.3 MG/ML IV SOLN
INTRAVENOUS | Status: DC
  Administered 2013-05-27 (×2): via INTRAVENOUS
  Filled 2013-05-27: qty 25

## 2013-05-27 MED ORDER — NALOXONE HCL 0.4 MG/ML IJ SOLN: 0.4000 mg | INTRAMUSCULAR | Status: DC | PRN

## 2013-05-27 MED ORDER — FENTANYL CITRATE 0.05 MG/ML IJ SOLN
INTRAMUSCULAR | Status: AC
  Administered 2013-05-27: 100 ug via INTRAVENOUS
  Filled 2013-05-27: qty 2

## 2013-05-27 MED ORDER — BUTORPHANOL TARTRATE 1 MG/ML IJ SOLN
1.0000 mg | INTRAMUSCULAR | Status: DC | PRN
  Administered 2013-05-27: 1 mg via INTRAVENOUS
  Filled 2013-05-27: qty 1

## 2013-05-27 MED ORDER — FENTANYL CITRATE 0.05 MG/ML IJ SOLN
INTRAMUSCULAR | Status: AC
  Filled 2013-05-27: qty 2

## 2013-05-27 MED ORDER — IBUPROFEN 600 MG PO TABS
600.0000 mg | ORAL_TABLET | Freq: Four times a day (QID) | ORAL | Status: DC | PRN
  Filled 2013-05-27: qty 1

## 2013-05-27 MED ORDER — ACETAMINOPHEN 160 MG/5ML PO SOLN
1000.0000 mg | Freq: Once | ORAL | Status: DC
  Filled 2013-05-27: qty 40

## 2013-05-27 MED ORDER — HYDROMORPHONE HCL PF 1 MG/ML IJ SOLN
1.0000 mg | INTRAMUSCULAR | Status: DC | PRN
  Administered 2013-05-27: 1 mg via INTRAVENOUS

## 2013-05-27 MED ORDER — OXYCODONE-ACETAMINOPHEN 5-325 MG/5ML PO SOLN: 5.0000 mL | ORAL | Status: DC | PRN

## 2013-05-27 MED ORDER — DIPHENHYDRAMINE HCL 50 MG/ML IJ SOLN
12.5000 mg | INTRAMUSCULAR | Status: DC | PRN
  Filled 2013-05-27: qty 1

## 2013-05-27 MED ORDER — CITRIC ACID-SODIUM CITRATE 334-500 MG/5ML PO SOLN
30.0000 mL | ORAL | Status: DC | PRN
  Filled 2013-05-27: qty 15

## 2013-05-27 MED ORDER — FLEET ENEMA 7-19 GM/118ML RE ENEM: 1.0000 | ENEMA | Freq: Every day | RECTAL | Status: DC | PRN

## 2013-05-27 MED ORDER — OXYCODONE HCL 5 MG/5ML PO SOLN: 5.0000 mg | ORAL | Status: DC | PRN

## 2013-05-27 MED ORDER — EPHEDRINE 5 MG/ML INJ: 10.0000 mg | INTRAVENOUS | Status: DC | PRN

## 2013-05-27 MED ORDER — PANTOPRAZOLE SODIUM 40 MG IV SOLR
40.0000 mg | INTRAVENOUS | Status: DC
  Filled 2013-05-27 (×2): qty 40

## 2013-05-27 MED ORDER — HYDROMORPHONE HCL PF 1 MG/ML IJ SOLN
1.0000 mg | INTRAMUSCULAR | Status: DC | PRN
  Filled 2013-05-27: qty 1

## 2013-05-27 MED ORDER — DIPHENHYDRAMINE HCL 50 MG/ML IJ SOLN: 12.5000 mg | Freq: Four times a day (QID) | INTRAMUSCULAR | Status: DC | PRN

## 2013-05-27 MED ORDER — LACTATED RINGERS IV SOLN
500.0000 mL | Freq: Once | INTRAVENOUS | Status: AC
  Administered 2013-05-27: 500 mL via INTRAVENOUS

## 2013-05-27 MED ORDER — DIPHENHYDRAMINE HCL 12.5 MG/5ML PO ELIX
12.5000 mg | ORAL_SOLUTION | Freq: Four times a day (QID) | ORAL | Status: DC | PRN
  Filled 2013-05-27: qty 5

## 2013-05-27 MED ORDER — FENTANYL 2.5 MCG/ML BUPIVACAINE 1/10 % EPIDURAL INFUSION (WH - ANES)
14.0000 mL/h | INTRAMUSCULAR | Status: DC | PRN
  Administered 2013-05-27: 14 mL/h via EPIDURAL
  Filled 2013-05-27: qty 125

## 2013-05-27 MED ORDER — LIDOCAINE HCL (PF) 1 % IJ SOLN
INTRAMUSCULAR | Status: DC | PRN
  Administered 2013-05-27 (×2): 5 mL

## 2013-05-27 MED ORDER — ACETAMINOPHEN 160 MG/5ML PO SOLN
650.0000 mg | Freq: Four times a day (QID) | ORAL | Status: DC | PRN
  Filled 2013-05-27: qty 20.3

## 2013-05-27 MED ORDER — ALBUTEROL SULFATE HFA 108 (90 BASE) MCG/ACT IN AERS
1.0000 | INHALATION_SPRAY | RESPIRATORY_TRACT | Status: DC | PRN
  Filled 2013-05-27: qty 6.7

## 2013-05-27 MED ORDER — OXYTOCIN 40 UNITS IN LACTATED RINGERS INFUSION - SIMPLE MED
1.0000 m[IU]/min | INTRAVENOUS | Status: DC
  Administered 2013-05-27: 1 m[IU]/min via INTRAVENOUS
  Filled 2013-05-27: qty 1000

## 2013-05-27 MED ORDER — CLINDAMYCIN PHOSPHATE 900 MG/50ML IV SOLN
900.0000 mg | Freq: Three times a day (TID) | INTRAVENOUS | Status: DC
  Administered 2013-05-27: 900 mg via INTRAVENOUS
  Filled 2013-05-27 (×2): qty 50

## 2013-05-27 MED ORDER — PRENATAL MULTIVITAMIN CH: 1.0000 | ORAL_TABLET | Freq: Every day | ORAL | Status: DC

## 2013-05-27 MED ORDER — EPHEDRINE 5 MG/ML INJ
10.0000 mg | INTRAVENOUS | Status: DC | PRN
  Filled 2013-05-27: qty 4

## 2013-05-27 MED ORDER — VANCOMYCIN HCL IN DEXTROSE 1-5 GM/200ML-% IV SOLN
1000.0000 mg | Freq: Two times a day (BID) | INTRAVENOUS | Status: DC
  Administered 2013-05-27: 1000 mg via INTRAVENOUS
  Filled 2013-05-27 (×4): qty 200

## 2013-05-27 MED ORDER — OXYTOCIN 40 UNITS IN LACTATED RINGERS INFUSION - SIMPLE MED: 62.5000 mL/h | INTRAVENOUS | Status: DC

## 2013-05-27 MED ORDER — ACETAMINOPHEN 160 MG/5ML PO SOLN
650.0000 mg | Freq: Three times a day (TID) | ORAL | Status: DC | PRN
  Filled 2013-05-27: qty 20.3

## 2013-05-27 MED ORDER — CALCIUM CARBONATE ANTACID 500 MG PO CHEW: 2.0000 | CHEWABLE_TABLET | ORAL | Status: DC | PRN

## 2013-05-27 MED ORDER — FENTANYL CITRATE 0.05 MG/ML IJ SOLN: 100.0000 ug | Freq: Once | INTRAMUSCULAR | Status: AC

## 2013-05-27 MED ORDER — HYDROMORPHONE 0.3 MG/ML IV SOLN
INTRAVENOUS | Status: DC
Start: 2013-05-27 — End: 2013-05-27
  Administered 2013-05-27: 12.67 mL via INTRAVENOUS

## 2013-05-27 NOTE — Progress Notes (Addendum)
  Subjective: Aware of some contractions as painful.  Leaking clear fluid.  Objective: BP 118/68  Pulse 65  Temp(Src) 98.3 F (36.8 C) (Oral)  Resp 18  Ht 5\' 3"  (1.6 m)  Wt 92.987 kg (205 lb)  BMI 36.32 kg/m2  LMP 08/27/2012      FHT:  Category 1 UC:   irregular, every 2-7 minutes SVE:   Dilation: 1 Effacement (%): 50 Station: -2 Exam by:: j. oxley cnm  Amnisure positive per confirmation by lab--initially reported as negative in error by lab, but immediately corrected.  I spoke with lab personnel to confirm positive result.  Assessment / Plan: IUP at 39 weeks SROM at 4:30am, early labor Previous C/S due to [facility] (? faiiled induction), with post-op wound infection requiring hospitalization for IV ATB. + GBS PCN allergic (hives as rxn)--GBS done by PCR in MAU 8/9 Wants BTL, but reports signed consent yesterday.  Plan: Reviewed status of SROM, early labor, previous C/S, +GBS with patient and family. Patient desires VBAC--R&B reviewed, including failure of trial of labor, risk of uterine rupture, lack of favorable cervical status, possible need for repeat C/S.  Patient seems to understand these risks and wishes to proceed with trial of labor. Recommend pitocin augmentation, with R&B reviewed, including increased risk of uterine rupture.  Patient agreeable with pitocin augmentation.   Plan IUPC placement when cervix adequately dilated for placement. Plan Clindamycin for GBS Rx. Pain medication prn. Will inform MD of patient's request for BTL, in case C/S is required.  Nigel Bridgeman 05/27/2013, 7:17 AM  Addendum: Found sensitivities for GBS--Clindamycin and Erythromycin resistant, sensitive to Vancomycin, Ampicillin, and PCN. Will change ATB to Vancomycin. Dr. Normand Sloop updated on patient status.  Nigel Bridgeman, CNM 05/27/13 9:10p

## 2013-05-27 NOTE — H&P (Signed)
Joanna Padilla is a 27 y.o. female, G2P1001 at [redacted]w[redacted]d weeks, presenting for PROM since 34.  Denies VB, UCs, recent fever, resp or GI c/o's, UTI or PIH s/s. GFM. May desire an epidural.  Patient Active Problem List   Diagnosis Date Noted  . Uterine size date discrepancy 05/27/2013  . Decreased fetal movement 05/27/2013  . Pap smear abnormality of cervix with LGSIL 05/27/2013  . History of cesarean delivery, currently pregnant 03/27/2013  . Previous cesarean delivery, antepartum condition or complication 03/09/2013  . Possible diastasis of pubic symphysis  03/09/2013  . Chlamydia infection complicating pregnancy 03/09/2013  . Sickle cell trait 03/09/2013  . Allergy to penicillin 03/09/2013  . Late prenatal care complicating pregnancy 01/29/2013  . Pregnancy, supervision of normal 05/01/2011  . OBESITY 12/26/2010  . ADJUSTMENT DISORDER 12/26/2010  . ASTHMA 12/26/2010    History of present pregnancy: Patient entered care at 22 weeks.   EDC of 06/03/13 was established by LMP.   Anatomy scan:  25 weeks, with normal findings and an posterior placenta.   Additional [place] evaluations:  38w0 Growth and BPP - EFW 42nd%ile, normal fluid, 8/8.   Significant prenatal events:  CT infection x 2 during pregnancy   Last evaluation:  05/22/13 at [redacted]w[redacted]d    2 cm / 50% / -2  OB History   Grav Para Term Preterm Abortions TAB SAB Ect Mult Living   2 1 1       1      Past Medical History  Diagnosis Date  . Acute bronchitis   . Unspecified asthma(493.90)   . Obesity, unspecified   . Seasonal allergies   . Sickle cell trait   . Chlamydia infection   . Urinary tract infection    Past Surgical History  Procedure Laterality Date  . Wisdom tooth extraction    . Cesarean section     Family History: family history includes Diabetes in her maternal grandmother and mother; Hyperlipidemia in her mother; Hypertension in her maternal grandmother and mother; Mental illness in her mother; Transient ischemic attack  in her mother. Social History:  reports that she has never smoked. She has never used smokeless tobacco. She reports that she does not drink alcohol or use illicit drugs.   Prenatal Transfer Tool  Maternal Diabetes: No - unknown at this time Genetic Screening: Declined Maternal Ultrasounds/Referrals: Normal Fetal Ultrasounds or other Referrals:  None Maternal Substance Abuse:  No Significant Maternal Medications:  None Significant Maternal Lab Results: Lab values include: Group B Strep positive    ROS: see HPI above, all other systems are negative  Allergies  Allergen Reactions  . Penicillins Hives     Dilation: 1 Effacement (%): 50 Station: -2 Exam by:: j. Witt Plitt cnm Blood pressure 118/68, pulse 65, temperature 97.5 F (36.4 C), temperature source Oral, resp. rate 18, height 5\' 3"  (1.6 m), weight 205 lb (92.987 kg), last menstrual period 08/27/2012.  Chest clear Heart RRR without murmur Abd gravid, NT Ext: WNL  FHR: Reactive UCs:  Occasional  Prenatal labs: ABO, Rh: A/POS/-- (04/23 1249) Antibody: NEG (04/23 1249) Rubella:   Immune RPR: NON REAC (04/23 1249)  HBsAg: NEGATIVE (04/23 1249)  HIV: NON REACTIVE (04/23 1249)  GBS: Positive (08/09 0000) Sickle cell/Hgb electrophoresis:  Normal Study Pap:  Unknown GC:  05/05/13 Neg Chlamydia:  05/05/13 Neg; pos 5/30 and 1/22 Genetic screenings:  No record Glucola:  Not resulted Other:  FFN on 6/19 (false pos)    Assessment/Plan: IUP at [redacted]w[redacted]d PROM  GBS pos Desires VBAC  Admit to BS per c/w Dr. Dion Body as attending MD Routine [facility] admission orders GBS prophylaxis per protocol Pitocin induction   Rowan Blase, MSN 05/27/2013, 6:40 AM

## 2013-05-27 NOTE — MAU Note (Signed)
Pt to room 170. 

## 2013-05-27 NOTE — Progress Notes (Signed)
  Subjective: Husband asleep at bedside, patient's 53 month old ambulating in room.  Objective: BP 118/68  Pulse 65  Temp(Src) 98.3 F (36.8 C) (Oral)  Resp 18  Ht 5\' 3"  (1.6 m)  Wt 92.987 kg (205 lb)  BMI 36.32 kg/m2  LMP 08/27/2012      FHT:  Category 1 UC:   irregular, every 2-5 minutes SVE:   Dilation: Closed Effacement (%):  (75) Station: -1 Exam by:: Emilee Hero, CNM Cervix posterior, outlet narrow. Leaking clear fluid.  Assessment / Plan: PROM at term, latent phase labor Previous C/S, desires VBAC GBS + Receiving 1st dose Clindamycin Reviewed again R&B of VBAC, in light of minimal cervix status.  Patient wants to continue trial of labor at present.  She understands she can choose repeat C/S at any point during her labor.  Noga Fogg 05/27/2013, 8:15 AM

## 2013-05-27 NOTE — Progress Notes (Addendum)
  Subjective: Requesting epidural--breathing with contractions.  Received Stadol at 11:45am, Benadryl 25 mg at 11:29am for itching after Vancomycin infused (completed around 10:30a).  No rash noted.  C/O reflux.  Denies SOB, but has had slight expiratory wheezing as she breathes rapidly with UCs.  Hx asthma in past, but has no inhaler.  Objective: BP 121/53  Pulse 110  Temp(Src) 98.4 F (36.9 C) (Oral)  Resp 24  Ht 5\' 3"  (1.6 m)  Wt 92.987 kg (205 lb)  BMI 36.32 kg/m2  SpO2 100%  LMP 08/27/2012     Chest clear FHT: Category 1 UC:   regular, every 3 minutes SVE:   Dilation: 5 Effacement (%):  (75) Station: -1 Exam by:: Emilee Hero, CNM Pitocin on 6 mu/min  Assessment / Plan: TOLAC--progressive labor Will place epidural and observe cervical progress. IUPC prn after epidural placement. Protonix 40 mg IV. Will obtain inhaler for prn use.  Nigel Bridgeman 05/27/2013, 12:52 PM

## 2013-05-27 NOTE — Anesthesia Preprocedure Evaluation (Signed)
Anesthesia Evaluation  Patient identified by MRN, date of birth, ID band Patient awake    Reviewed: Allergy & Precautions, H&P , NPO status , Patient's Chart, lab work & pertinent test results  Airway Mallampati: II TM Distance: >3 FB Neck ROM: full    Dental no notable dental hx.    Pulmonary asthma ,  Uses albuterol inhaler   Pulmonary exam normal  + stridor     Cardiovascular negative cardio ROS      Neuro/Psych negative neurological ROS  negative psych ROS   GI/Hepatic negative GI ROS, Neg liver ROS,   Endo/Other  negative endocrine ROS  Renal/GU negative Renal ROS  negative genitourinary   Musculoskeletal negative musculoskeletal ROS (+)   Abdominal Normal abdominal exam  (+)   Peds  Hematology negative hematology ROS (+)   Anesthesia Other Findings   Reproductive/Obstetrics (+) Pregnancy                           Anesthesia Physical Anesthesia Plan  ASA: II  Anesthesia Plan: Epidural   Post-op Pain Management:    Induction:   Airway Management Planned:   Additional Equipment:   Intra-op Plan:   Post-operative Plan:   Informed Consent: I have reviewed the patients History and Physical, chart, labs and discussed the procedure including the risks, benefits and alternatives for the proposed anesthesia with the patient or authorized representative who has indicated his/her understanding and acceptance.     Plan Discussed with:   Anesthesia Plan Comments:         Anesthesia Quick Evaluation

## 2013-05-27 NOTE — Anesthesia Procedure Notes (Signed)

## 2013-05-27 NOTE — Progress Notes (Signed)
Called to see patient approx 45 min after delivery with c/o pressure and pain on the left side of the vagina.  Fundus firm, Lochia small amount Perineum--small amount of bleeding from site of 1st degree periurethral laceration repair. Area of tenderness and induration approx 1 cm into the vaginal vault on the left side, extending approx 4 cm along vaginal wall toward cervix--width approx 2 cm.  Size appears stable--no advancement in size during evaluation.  Fentanyl 100 mcg IV given for comfort prior to exploration. Epidural redosed by anesthesia. CBC drawn at 4:45pm. Dr. Normand Sloop notified--she will come see the patient.  VSS.  CBC at 4:45p    Component Value Date/Time   WBC 23.4* 05/27/2013 1647   RBC 4.56 05/27/2013 1647   HGB 13.2 05/27/2013 1647   HCT 37.8 05/27/2013 1647   PLT 319 05/27/2013 1647   MCV 82.9 05/27/2013 1647   MCH 28.9 05/27/2013 1647   MCHC 34.9 05/27/2013 1647   RDW 12.5 05/27/2013 1647   LYMPHSABS 1.7 01/29/2013 1249   MONOABS 0.5 01/29/2013 1249   EOSABS 0.1 01/29/2013 1249   BASOSABS 0.0 01/29/2013 1249     Nigel Bridgeman, CNM 05/27/13 4:30pm

## 2013-05-27 NOTE — Progress Notes (Signed)
  Subjective: Feeling pressure--had been comfortable s/p epidural placement.  Objective: BP 88/57  Pulse 68  Temp(Src) 98.3 F (36.8 C) (Oral)  Resp 20  Ht 5\' 3"  (1.6 m)  Wt 92.987 kg (205 lb)  BMI 36.32 kg/m2  SpO2 100%  LMP 08/27/2012      FHT:  Category 1 UC:   regular, every 3 minutes Initial exam was 7 cm, 75%, vtx -2. Called anesthesia for consult regarding PCA doses vs. Anesthesia redosing due to Dr. Edison Pace assessment of very narrow epidural space.  Patient then began to feel even more pressure, and the cervix was re-examined with findings below. SVE:   Dilation: 10 Effacement (%): 100 Station: +1 Exam by:: Oryon Gary cnm  Assessment / Plan: 2nd stage labor TOLAC Patient's partner currently away from hospital--he is on his way back to hospital.  Per patient's request, will try to defer active pushing until partner returns to hospital.  He advises he is on his way now (approx 10-15 min away).  Nigel Bridgeman 05/27/2013 2:56p

## 2013-05-27 NOTE — Progress Notes (Addendum)
Called to see patient due to increased pain of hematoma--epidural analgesia wearing off. Patient very anxious regarding pain.  Filed Vitals:   05/27/13 1858 05/27/13 1902 05/27/13 1903 05/27/13 1908  BP:  123/77    Pulse: 83 90 98 93  Temp:      TempSrc:      Resp:  22    Height:      Weight:      SpO2: 100%  100% 100%   Dilaudid 1 mg at 7pm with minimal benefit, other than sedation. Foley in place s/p Dr. Redmond Baseman examination of vaginal hematoma.  Gentle digital exploration of vagina--no apparent advancement of vaginal hematoma at present. Fundus firm Lochia small/moderate. Periurethral repair intact.  Consulted with Dr. Normand Sloop.  She will discuss patient with Dr. Richardson Dopp as on-coming MD, and f/u with Haroldine Laws, CNM, regarding plan. Oral pain med orders modified for liquid form (patient reports difficulty taking pills).   Nigel Bridgeman, CNM 05/27/13 7:30pm

## 2013-05-27 NOTE — Progress Notes (Signed)
After vancomycin infusion completed patient started complaining of itching of her head, chest, and left hand. CNM to be notified.

## 2013-05-27 NOTE — Progress Notes (Signed)
Lab called- amniosure documented negative in error. Amniosure is positve

## 2013-05-27 NOTE — MAU Note (Signed)
Pt reports leaking fluid since 0430 and having contractions.  G2 P1 at 39wks, previous C/S, plans for vag delivery.

## 2013-05-27 NOTE — Progress Notes (Signed)
Hatchett and Jean Rosenthal, anesthesia MD's, notified that pt is refusing second IV site for high PPH status.

## 2013-05-28 LAB — CBC
HCT: 27 % — ABNORMAL LOW (ref 36.0–46.0)
HCT: 28.1 % — ABNORMAL LOW (ref 36.0–46.0)
HCT: 30.4 % — ABNORMAL LOW (ref 36.0–46.0)
Hemoglobin: 10.6 g/dL — ABNORMAL LOW (ref 12.0–15.0)
Hemoglobin: 9.4 g/dL — ABNORMAL LOW (ref 12.0–15.0)
MCH: 28.7 pg (ref 26.0–34.0)
MCH: 28.8 pg (ref 26.0–34.0)
MCH: 28.8 pg (ref 26.0–34.0)
MCHC: 34.8 g/dL (ref 30.0–36.0)
MCV: 82.4 fL (ref 78.0–100.0)
MCV: 82.6 fL (ref 78.0–100.0)
RBC: 3.69 MIL/uL — ABNORMAL LOW (ref 3.87–5.11)
RDW: 12.5 % (ref 11.5–15.5)
RDW: 12.6 % (ref 11.5–15.5)
WBC: 18.3 10*3/uL — ABNORMAL HIGH (ref 4.0–10.5)

## 2013-05-28 MED ORDER — PRENATAL MULTIVITAMIN CH
1.0000 | ORAL_TABLET | Freq: Every day | ORAL | Status: DC
  Filled 2013-05-28: qty 1

## 2013-05-28 MED ORDER — BENZOCAINE-MENTHOL 20-0.5 % EX AERO
1.0000 "application " | INHALATION_SPRAY | CUTANEOUS | Status: DC | PRN
  Administered 2013-05-28: 1 via TOPICAL
  Filled 2013-05-28: qty 56

## 2013-05-28 MED ORDER — TETANUS-DIPHTH-ACELL PERTUSSIS 5-2.5-18.5 LF-MCG/0.5 IM SUSP
0.5000 mL | Freq: Once | INTRAMUSCULAR | Status: DC
  Filled 2013-05-28: qty 0.5

## 2013-05-28 MED ORDER — LANOLIN HYDROUS EX OINT: TOPICAL_OINTMENT | CUTANEOUS | Status: DC | PRN

## 2013-05-28 MED ORDER — WITCH HAZEL-GLYCERIN EX PADS: 1.0000 "application " | MEDICATED_PAD | CUTANEOUS | Status: DC | PRN

## 2013-05-28 MED ORDER — MEDROXYPROGESTERONE ACETATE 150 MG/ML IM SUSP: 150.0000 mg | INTRAMUSCULAR | Status: DC | PRN

## 2013-05-28 MED ORDER — SIMETHICONE 80 MG PO CHEW
80.0000 mg | CHEWABLE_TABLET | ORAL | Status: DC | PRN
  Administered 2013-05-28: 80 mg via ORAL

## 2013-05-28 MED ORDER — IBUPROFEN 600 MG PO TABS: 600.0000 mg | ORAL_TABLET | Freq: Four times a day (QID) | ORAL | Status: DC

## 2013-05-28 MED ORDER — ONDANSETRON HCL 4 MG/2ML IJ SOLN: 4.0000 mg | INTRAMUSCULAR | Status: DC | PRN

## 2013-05-28 MED ORDER — DIBUCAINE 1 % RE OINT
1.0000 "application " | TOPICAL_OINTMENT | RECTAL | Status: DC | PRN
  Filled 2013-05-28: qty 28

## 2013-05-28 MED ORDER — SENNOSIDES-DOCUSATE SODIUM 8.6-50 MG PO TABS
2.0000 | ORAL_TABLET | Freq: Every day | ORAL | Status: DC
  Administered 2013-05-28: 2 via ORAL

## 2013-05-28 MED ORDER — ONDANSETRON HCL 4 MG PO TABS: 4.0000 mg | ORAL_TABLET | ORAL | Status: DC | PRN

## 2013-05-28 MED ORDER — DIPHENHYDRAMINE HCL 25 MG PO CAPS: 25.0000 mg | ORAL_CAPSULE | Freq: Four times a day (QID) | ORAL | Status: DC | PRN

## 2013-05-28 MED ORDER — HYDROMORPHONE 0.3 MG/ML IV SOLN
INTRAVENOUS | Status: DC
  Administered 2013-05-28: 0.3 mg via INTRAVENOUS
  Administered 2013-05-28: 5.29 mg via INTRAVENOUS

## 2013-05-28 MED ORDER — IBUPROFEN 100 MG/5ML PO SUSP
600.0000 mg | Freq: Four times a day (QID) | ORAL | Status: DC
  Administered 2013-05-28 – 2013-05-29 (×6): 600 mg via ORAL
  Filled 2013-05-28 (×11): qty 30

## 2013-05-28 MED ORDER — BENZOCAINE-MENTHOL 20-0.5 % EX AERO
1.0000 "application " | INHALATION_SPRAY | CUTANEOUS | Status: DC | PRN
  Filled 2013-05-28 (×2): qty 56

## 2013-05-28 MED ORDER — SIMETHICONE 80 MG PO CHEW: 80.0000 mg | CHEWABLE_TABLET | ORAL | Status: DC | PRN

## 2013-05-28 MED ORDER — SENNOSIDES-DOCUSATE SODIUM 8.6-50 MG PO TABS: 2.0000 | ORAL_TABLET | Freq: Every day | ORAL | Status: DC

## 2013-05-28 MED ORDER — OXYCODONE-ACETAMINOPHEN 5-325 MG PO TABS
1.0000 | ORAL_TABLET | ORAL | Status: DC | PRN
  Administered 2013-05-29 (×3): 1 via ORAL
  Filled 2013-05-28 (×3): qty 1

## 2013-05-28 MED ORDER — ZOLPIDEM TARTRATE 5 MG PO TABS: 5.0000 mg | ORAL_TABLET | Freq: Every evening | ORAL | Status: DC | PRN

## 2013-05-28 NOTE — Progress Notes (Signed)
Pt. Refused TDAP shot after pt. Education completed.

## 2013-05-28 NOTE — Progress Notes (Signed)
Post Partum Day 1 Subjective: pt states her pain is well controlled.  She is hungry  Objective: Blood pressure 110/48, pulse 70, temperature 97.9 F (36.6 C), temperature source Oral, resp. rate 18, height 5\' 3"  (1.6 m), weight 205 lb (92.987 kg), last menstrual period 08/27/2012, SpO2 99.00%, unknown if currently breastfeeding.  Physical Exam:  General: alert and cooperative Lochia: appropriate Uterine Fundus: firm Incision: na DVT Evaluation: No evidence of DVT seen on physical exam.   Recent Labs  05/28/13 0347 05/28/13 0726  HGB 9.8* 9.4*  HCT 28.1* 27.0*    Assessment/Plan: PPD# 1  Left vaginal wall hematoma Plan for discharge tomorrow possibly Turn off PCA.  If pain is controlled will DC hgb is stable check CBC in am Regular diet If able to ambulte DC foley   LOS: 1 day   Leighla Chestnutt A 05/28/2013, 11:20 AM

## 2013-05-28 NOTE — Progress Notes (Signed)
Pt. Stable and transferred to [facility]. Bedside report given to Vernona Rieger, RN. All belongings are with the pt. And all questions answered.

## 2013-05-28 NOTE — Progress Notes (Signed)
Patient ID: Joanna Padilla, female   DOB: 12-Sep-1986, 27 y.o.   MRN: [id number] Pts pain is well controlled.  She has urinated without difficulty.  i will transfer her to the floor.

## 2013-05-29 LAB — CBC WITH DIFFERENTIAL/PLATELET
Basophils Absolute: 0 10*3/uL (ref 0.0–0.1)
Basophils Relative: 0 % (ref 0–1)
Eosinophils Absolute: 0.2 10*3/uL (ref 0.0–0.7)
HCT: 25.6 % — ABNORMAL LOW (ref 36.0–46.0)
Hemoglobin: 8.9 g/dL — ABNORMAL LOW (ref 12.0–15.0)
MCH: 29.3 pg (ref 26.0–34.0)
MCHC: 34.8 g/dL (ref 30.0–36.0)
Monocytes Absolute: 1 10*3/uL (ref 0.1–1.0)
Monocytes Relative: 7 % (ref 3–12)
Neutrophils Relative %: 72 % (ref 43–77)
RDW: 12.8 % (ref 11.5–15.5)

## 2013-05-29 MED ORDER — CYCLOBENZAPRINE HCL 5 MG PO TABS
5.0000 mg | ORAL_TABLET | Freq: Three times a day (TID) | ORAL | Status: DC | PRN
  Administered 2013-05-29: 5 mg via ORAL
  Filled 2013-05-29: qty 1

## 2013-05-29 MED ORDER — OXYCODONE-ACETAMINOPHEN 5-325 MG PO TABS: 1.0000 | ORAL_TABLET | ORAL | Status: DC | PRN

## 2013-05-29 MED ORDER — IBUPROFEN 100 MG/5ML PO SUSP: 600.0000 mg | Freq: Four times a day (QID) | ORAL | Status: DC

## 2013-05-29 MED ORDER — FERRAPLUS 90 90-1 MG PO TABS: 1.0000 | ORAL_TABLET | Freq: Every day | ORAL | Status: DC

## 2013-05-29 NOTE — Anesthesia Postprocedure Evaluation (Signed)
  Anesthesia Post-op Note  Patient: Joanna Padilla  Procedure(s) Performed: * No procedures listed *  Patient Location: Mother/Baby  Anesthesia Type:Epidural  Level of Consciousness: awake, alert  and oriented  Airway and Oxygen Therapy: Patient Spontanous Breathing  Post-op Pain: none  Post-op Assessment: Post-op Vital signs reviewed, Patient's Cardiovascular Status Stable, No headache, No backache, No residual numbness and No residual motor weakness  Post-op Vital Signs: Reviewed and stable  Complications: No apparent anesthesia complications
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
## 2013-05-29 NOTE — Anesthesia Postprocedure Evaluation (Signed)
Anesthesia Post Note  Patient: Joanna Padilla  Procedure(s) Performed: * No procedures listed *  Anesthesia type: Epidural  Patient location: Mother/Baby  Post pain: Pain level controlled  Post assessment: Post-op Vital signs reviewed  Last Vitals:  Filed Vitals:   05/29/13 0520  BP: 109/70  Pulse: 67  Temp: 37.1 C  Resp: 20    Post vital signs: Reviewed  Level of consciousness: awake  Complications: No apparent anesthesia complications

## 2013-05-29 NOTE — Discharge Summary (Signed)
  Vaginal Delivery Discharge Summary  EPHRATA VERVILLE  DOB:    May 05, 1986 MRN:    [id number] CSN:    [id number]  Date of admission:                  05/27/2013  Date of discharge:                   05/29/2013  Procedures this admission:  Date of Delivery: 05/27/2013  Newborn Data:  Live born female  Birth Weight: 5 lb 1.5 oz (2310 g) APGAR: 9, 9  Home with mother.   History of Present Illness:  [name] is a 27 y.o. female, G2P2002, who presents at [redacted]w[redacted]d weeks gestation. The patient has been followed at the [facility] division of [facility]. She was admitted rupture of membranes. Her pregnancy has been complicated by: none.  Hospital course:  The patient was admitted for PROM.   Her labor was not complicated. She proceeded to have a vaginal delivery of a healthy infant. Her delivery was not complicated. Her postpartum course was complicated by the development L side wall hematoma, that did not require surgical intervention.  She was discharged to home on postpartum day 2 doing well.  Feeding:  breast  Contraception: plans BTL, coming to office at 4wks to get scheduled at 6wks (consent was signed late in pregnancy)   bilateral tubal ligation  Discharge hemoglobin:  Hemoglobin  Date Value Range Status  05/29/2013 8.9* 12.0 - 15.0 g/dL Final     HCT  Date Value Range Status  05/29/2013 25.6* 36.0 - 46.0 % Final    Discharge Physical Exam:   General: alert and no distress Lochia: appropriate Uterine Fundus: firm Incision: healing well DVT Evaluation: No evidence of DVT seen on physical exam. Negative Homan's sign. No significant calf/ankle edema.  Intrapartum Procedures: spontaneous vaginal delivery, GBS prophylaxis and labor epidural, pitocin augmentation  Postpartum Procedures: none Complications-Operative and Postpartum: L wall hematoma degree perineal laceration and 2nd degree  Discharge Diagnoses:  Term Pregnancy-delivered and successful VBAC  Discharge Information:  Activity:           pelvic rest Diet:                routine Medications: PNV, Ibuprofen, Iron and Percocet Condition:      stable Instructions:  see printed instructions below Discharge to: home  Follow-up Information   Follow up with [facility] In 5 weeks.   Specialty:  Obstetrics and Gynecology   Contact information:   [address] [place] [address] [phone]       Joanna Padilla 05/29/2013

## 2013-06-02 ENCOUNTER — Encounter (HOSPITAL_COMMUNITY): Payer: Self-pay | Admitting: *Deleted

## 2013-06-02 ENCOUNTER — Inpatient Hospital Stay (HOSPITAL_COMMUNITY)
Admission: AD | Admit: 2013-06-02 | Discharge: 2013-06-02 | Disposition: A | Discharge status: Home / Self Care | Payer: Medicaid Other | Source: Ambulatory Visit | Attending: Obstetrics and Gynecology | Admitting: Obstetrics and Gynecology

## 2013-06-02 DIAGNOSIS — N949 Unspecified condition associated with female genital organs and menstrual cycle: Secondary | ICD-10-CM | POA: Insufficient documentation

## 2013-06-02 HISTORY — DX: Unspecified abnormal cytological findings in specimens from cervix uteri: R87.619

## 2013-06-02 HISTORY — DX: Reserved for concepts with insufficient information to code with codable children: IMO0002

## 2013-06-02 HISTORY — DX: Unspecified infectious disease: B99.9

## 2013-06-02 LAB — CBC WITH DIFFERENTIAL/PLATELET
Basophils Absolute: 0 10*3/uL (ref 0.0–0.1)
Basophils Relative: 0 % (ref 0–1)
Eosinophils Absolute: 0.2 10*3/uL (ref 0.0–0.7)
MCH: 28.9 pg (ref 26.0–34.0)
MCHC: 34 g/dL (ref 30.0–36.0)
Neutrophils Relative %: 77 % (ref 43–77)
Platelets: 396 10*3/uL (ref 150–400)
RDW: 12.3 % (ref 11.5–15.5)

## 2013-06-02 MED ORDER — HYDROCODONE-ACETAMINOPHEN 5-325 MG PO TABS
1.0000 | ORAL_TABLET | Freq: Once | ORAL | Status: AC
  Administered 2013-06-02: 1 via ORAL
  Filled 2013-06-02: qty 1

## 2013-06-02 MED ORDER — HYDROCODONE-ACETAMINOPHEN 5-325 MG PO TABS: 2.0000 | ORAL_TABLET | Freq: Four times a day (QID) | ORAL | Status: DC | PRN

## 2013-06-02 MED ORDER — LIDOCAINE 5 % EX PTCH: 1.0000 | MEDICATED_PATCH | CUTANEOUS | Status: DC

## 2013-06-02 NOTE — MAU Note (Signed)
Pain is all on left side, like it was in the hosp,  Nerve pain into leg and pressure
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
## 2013-06-02 NOTE — MAU Note (Signed)
Baby is doing well, bottle feeding, started pumping.

## 2013-06-02 NOTE — MAU Note (Signed)
Pt states had vaginal delivery last Tuesday, during labor developed internal hematoma on left inner portion of her vagina. Was going to have surgery however hematoma didn't get bigger, pain was relieved when pt stands up. States since she left has taken percocet and flexeril which are ineffective for pain. Feels internal pressure like she still is pregnant. Very painful with sitting or standing. Feels like is pulling on a nerve.
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
## 2013-11-11 ENCOUNTER — Encounter (HOSPITAL_COMMUNITY): Payer: Self-pay | Admitting: Emergency Medicine

## 2013-11-11 ENCOUNTER — Emergency Department (HOSPITAL_COMMUNITY)
Admission: EM | Admit: 2013-11-11 | Discharge: 2013-11-11 | Disposition: A | Discharge status: Home / Self Care | Payer: Medicaid Other | Attending: Emergency Medicine | Admitting: Emergency Medicine

## 2013-11-11 ENCOUNTER — Emergency Department (HOSPITAL_COMMUNITY): Payer: Medicaid Other

## 2013-11-11 DIAGNOSIS — R059 Cough, unspecified: Secondary | ICD-10-CM | POA: Insufficient documentation

## 2013-11-11 DIAGNOSIS — R079 Chest pain, unspecified: Secondary | ICD-10-CM

## 2013-11-11 DIAGNOSIS — J45909 Unspecified asthma, uncomplicated: Secondary | ICD-10-CM | POA: Insufficient documentation

## 2013-11-11 DIAGNOSIS — E669 Obesity, unspecified: Secondary | ICD-10-CM | POA: Insufficient documentation

## 2013-11-11 DIAGNOSIS — Z8659 Personal history of other mental and behavioral disorders: Secondary | ICD-10-CM | POA: Insufficient documentation

## 2013-11-11 DIAGNOSIS — Z79899 Other long term (current) drug therapy: Secondary | ICD-10-CM | POA: Insufficient documentation

## 2013-11-11 DIAGNOSIS — Z87440 Personal history of urinary (tract) infections: Secondary | ICD-10-CM | POA: Insufficient documentation

## 2013-11-11 DIAGNOSIS — R002 Palpitations: Secondary | ICD-10-CM | POA: Insufficient documentation

## 2013-11-11 DIAGNOSIS — R05 Cough: Secondary | ICD-10-CM | POA: Insufficient documentation

## 2013-11-11 DIAGNOSIS — F430 Acute stress reaction: Secondary | ICD-10-CM | POA: Insufficient documentation

## 2013-11-11 DIAGNOSIS — M79609 Pain in unspecified limb: Secondary | ICD-10-CM | POA: Insufficient documentation

## 2013-11-11 DIAGNOSIS — R0789 Other chest pain: Secondary | ICD-10-CM | POA: Insufficient documentation

## 2013-11-11 DIAGNOSIS — Z880 Allergy status to penicillin: Secondary | ICD-10-CM | POA: Insufficient documentation

## 2013-11-11 DIAGNOSIS — Z8619 Personal history of other infectious and parasitic diseases: Secondary | ICD-10-CM | POA: Insufficient documentation

## 2013-11-11 DIAGNOSIS — Z862 Personal history of diseases of the blood and blood-forming organs and certain disorders involving the immune mechanism: Secondary | ICD-10-CM | POA: Insufficient documentation

## 2013-11-11 HISTORY — DX: Anxiety disorder, unspecified: F41.9

## 2013-11-11 LAB — BASIC METABOLIC PANEL
BUN: 7 mg/dL (ref 6–23)
CO2: 23 mEq/L (ref 19–32)
Calcium: 9.3 mg/dL (ref 8.4–10.5)
Chloride: 101 mEq/L (ref 96–112)
Creatinine, Ser: 0.69 mg/dL (ref 0.50–1.10)
GFR calc Af Amer: >90 mL/min (ref >90)
GFR calc non Af Amer: >90 mL/min (ref >90)
Glucose, Bld: 92 mg/dL (ref 70–99)
Potassium: 3.4 mEq/L — ABNORMAL LOW (ref 3.7–5.3)
Sodium: 139 mEq/L (ref 137–147)

## 2013-11-11 LAB — CBC
HCT: 35.6 % — ABNORMAL LOW (ref 36.0–46.0)
Hemoglobin: 12.2 g/dL (ref 12.0–15.0)
MCH: 27.6 pg (ref 26.0–34.0)
MCHC: 34.3 g/dL (ref 30.0–36.0)
MCV: 80.5 fL (ref 78.0–100.0)
Platelets: 483 10*3/uL — ABNORMAL HIGH (ref 150–400)
RBC: 4.42 MIL/uL (ref 3.87–5.11)
RDW: 12.3 % (ref 11.5–15.5)
WBC: 10.2 10*3/uL (ref 4.0–10.5)

## 2013-11-11 LAB — POCT I-STAT TROPONIN I: Troponin i, poc: 0 ng/mL (ref 0.00–0.08)

## 2013-11-11 LAB — D-DIMER, QUANTITATIVE (NOT AT ARMC): D-Dimer, Quant: 0.28 ug/mL-FEU (ref 0.00–0.48)

## 2013-11-11 NOTE — ED Provider Notes (Signed)
CSN: [id number]     Arrival date & time 11/11/13  1603 History   First MD Initiated Contact with Patient 11/11/13 1614     Chief Complaint  Patient presents with  . Chest Pain   (Consider location/radiation/quality/duration/timing/severity/associated sxs/prior Treatment) HPI Pt is a 28yo female with hx of asthma and sickle cell trait c/o intermittent left sided chest pain that has come and gone for the last 5 months. Pt went to urgent care earlier today for same but was transported by EMS to ED for further evaluation. Pt states pain is sharp and shooting, 8/10 at worst, radiates down left arm. Reports pain being 1/10 at this time.  States when pain starts it lasts 5-10 min at a time.  Nothing makes pain better or worse. Reports having to use her albuterol inhaler 1-2x/wk recently and has had intermittent productive cough with sick contacts. Denies fever, n/v/d. Denies known CAD. Denies family hx of CAD. Does reports generalized increased stress.   Past Medical History  Diagnosis Date  . Acute bronchitis   . Unspecified asthma(493.90)   . Obesity, unspecified   . Seasonal allergies   . Sickle cell trait   . Chlamydia infection   . Urinary tract infection   . Infection     UTI  . Abnormal Pap smear   . Anxiety    Past Surgical History  Procedure Laterality Date  . Wisdom tooth extraction    . Cesarean section    . Abdominal surgery      c-section   Family History  Problem Relation Age of Onset  . Diabetes Mother   . Hypertension Mother   . Hyperlipidemia Mother   . Transient ischemic attack Mother   . Mental illness Mother   . Diabetes Maternal Grandmother   . Hypertension Maternal Grandmother    History  Substance Use Topics  . Smoking status: Never Smoker   . Smokeless tobacco: Never Used  . Alcohol Use: No   OB History   Grav Para Term Preterm Abortions TAB SAB Ect Mult Living   2 2 2       2      Review of Systems  Constitutional: Negative for fever, chills and  diaphoresis.  Respiratory: Negative for shortness of breath.   Cardiovascular: Positive for chest pain and palpitations. Negative for leg swelling.  Gastrointestinal: Negative for nausea, vomiting, abdominal pain, diarrhea and constipation.  Musculoskeletal: Positive for myalgias ( left arm). Negative for back pain.  Neurological: Negative for weakness and numbness.  All other systems reviewed and are negative.    Allergies  Penicillins  Home Medications   Current Outpatient Rx  Name  Route  Sig  Dispense  Refill  . albuterol (PROVENTIL HFA;VENTOLIN HFA) 108 (90 BASE) MCG/ACT inhaler   Inhalation   Inhale 1-2 puffs into the lungs every 6 (six) hours as needed for wheezing or shortness of breath.         . Norethindrone-Ethinyl Estradiol-Fe Biphas (LO LOESTRIN FE) 1 MG-10 MCG / 10 MCG tablet   Oral   Take 1 tablet by mouth daily.          BP 129/71  Pulse 63  Temp(Src) 98.1 F (36.7 C) (Oral)  Resp 14  SpO2 100%  LMP 11/11/2013  Breastfeeding? No Physical Exam  Nursing note and vitals reviewed. Constitutional: She appears well-developed and well-nourished. No distress.  Pt lying comfortably in exam bed, NAD.   HENT:  Head: Normocephalic and atraumatic.  Eyes: Conjunctivae are normal.  No scleral icterus.  Neck: Normal range of motion. Neck supple.  Cardiovascular: Normal rate, regular rhythm and normal heart sounds.   Pulmonary/Chest: Effort normal and breath sounds normal. No respiratory distress. She has no wheezes. She has no rales. She exhibits no tenderness.  No respiratory distress, able to speak in full sentences w/o difficulty. Lungs: CTAB. No chest wall tenderness.  Abdominal: Soft. Bowel sounds are normal. She exhibits no distension and no mass. There is no tenderness. There is no rebound and no guarding.  Musculoskeletal: Normal range of motion.  Neurological: She is alert.  Skin: Skin is warm and dry. She is not diaphoretic.    ED Course  Procedures  (including critical care time) Labs Review Labs Reviewed  CBC - Abnormal; Notable for the following:    HCT 35.6 (*)    Platelets 483 (*)    All other components within normal limits  BASIC METABOLIC PANEL - Abnormal; Notable for the following:    Potassium 3.4 (*)    All other components within normal limits  D-DIMER, QUANTITATIVE  POCT I-STAT TROPONIN I   Imaging Review Dg Chest 2 View  11/11/2013   CLINICAL DATA:  Dyspnea, history of asthma  EXAM: CHEST  2 VIEW  COMPARISON:  None.  FINDINGS: The lungs are adequately inflated. There are coarse infrahilar lung markings on the left posteriorly. The cardiac silhouette is normal in size. The pulmonary vascularity is not engorged. The mediastinum is normal in width. There is no pleural effusion. The observed portions of the bony thorax exhibit no acute abnormality.  IMPRESSION: Coarse lung markings in the infrahilar region posteriorly on the left suggests subsegmental atelectasis or early interstitial pneumonia in the lower lobe. There is no evidence of CHF.   Electronically Signed   By: David  [place]   On: 11/11/2013 17:45    EKG Interpretation    Date/Time:  Tuesday November 11 2013 16:15:19 EST Ventricular Rate:  62 PR Interval:  142 QRS Duration: 82 QT Interval:  429 QTC Calculation: 436 R Axis:   59 Text Interpretation:  Sinus rhythm Borderline Q waves in inferior leads No previous tracing Confirmed by BEATON  MD, ROBERT ([id number]) on 11/11/2013 4:23:43 PM            MDM   1. Chest pain    Pt presenting with intermittent left sided chest pain. Pain has been intermittent for several months, increasing in frequency today. Does report increased stress.  Nothing makes pain better or worse. Pain is sharp in nature, lasting 5min at a time.    EKG: unremarkable Troponin: negative D-dimer: negative  CXR: possible atelectasis vs early interstitial pneumonia.  As pt denies fever, n/v/d. No hx of URI symptoms such as cough or  congestion, low concern for pneumonia.  Do not believe antibiotics need to be started at this time.  Return precautions given, if symptoms worsen, pt may need further evaluation and treatment.   All labs/imaging/findings discussed with patient. All questions answered and concerns addressed. Will discharge pt home and [facility]. Return precautions given. Pt verbalized understanding and agreement with tx plan. Vitals: unremarkable. Discharged in stable condition.    Discussed pt with attending during ED encounter and agrees with plan.   Junius Finnerrin O'Malley, PA-C 11/11/13 2031

## 2013-11-11 NOTE — ED Notes (Signed)
Pt returned from radiology.

## 2013-11-11 NOTE — ED Notes (Signed)
Called main lab, no blue top in main lab. Will draw a blue top.

## 2013-11-11 NOTE — ED Notes (Signed)
PT presented to urgent care for chest pain and was sent via EMS to ED. Patient reports 1/10 pain at this time and describes pain as shooting intermittent pain. PT noted to be in sinus arrythmia PTA. PT given 324mg  ASA and nitro x1 en route with no relief.

## 2013-11-11 NOTE — Discharge Instructions (Signed)
Chest Pain (Nonspecific) °It is often hard to give a specific diagnosis for the cause of chest pain. There is always a chance that your pain could be related to something serious, such as a heart attack or a blood clot in the lungs. You need to follow up with your caregiver for further evaluation. °CAUSES  °· Heartburn. °· Pneumonia or bronchitis. °· Anxiety or stress. °· Inflammation around your heart (pericarditis) or lung (pleuritis or pleurisy). °· A blood clot in the lung. °· A collapsed lung (pneumothorax). It can develop suddenly on its own (spontaneous pneumothorax) or from injury (trauma) to the chest. °· Shingles infection (herpes zoster virus). °The chest wall is composed of bones, muscles, and cartilage. Any of these can be the source of the pain. °· The bones can be bruised by injury. °· The muscles or cartilage can be strained by coughing or overwork. °· The cartilage can be affected by inflammation and become sore (costochondritis). °DIAGNOSIS  °Lab tests or other studies, such as X-rays, electrocardiography, stress testing, or cardiac imaging, may be needed to find the cause of your pain.  °TREATMENT  °· Treatment depends on what may be causing your chest pain. Treatment may include: °· Acid blockers for heartburn. °· Anti-inflammatory medicine. °· Pain medicine for inflammatory conditions. °· Antibiotics if an infection is present. °· You may be advised to change lifestyle habits. This includes stopping smoking and avoiding alcohol, caffeine, and chocolate. °· You may be advised to keep your head raised (elevated) when sleeping. This reduces the chance of acid going backward from your stomach into your esophagus. °· Most of the time, nonspecific chest pain will improve within 2 to 3 days with rest and mild pain medicine. °HOME CARE INSTRUCTIONS  °· If antibiotics were prescribed, take your antibiotics as directed. Finish them even if you start to feel better. °· For the next few days, avoid physical  activities that bring on chest pain. Continue physical activities as directed. °· Do not smoke. °· Avoid drinking alcohol. °· Only take over-the-counter or prescription medicine for pain, discomfort, or fever as directed by your caregiver. °· Follow your caregiver's suggestions for further testing if your chest pain does not go away. °· Keep any follow-up appointments you made. If you do not go to an appointment, you could develop lasting (chronic) problems with pain. If there is any problem keeping an appointment, you must call to reschedule. °SEEK MEDICAL CARE IF:  °· You think you are having problems from the medicine you are taking. Read your medicine instructions carefully. °· Your chest pain does not go away, even after treatment. °· You develop a rash with blisters on your chest. °SEEK IMMEDIATE MEDICAL CARE IF:  °· You have increased chest pain or pain that spreads to your arm, neck, jaw, back, or abdomen. °· You develop shortness of breath, an increasing cough, or you are coughing up blood. °· You have severe back or abdominal pain, feel nauseous, or vomit. °· You develop severe weakness, fainting, or chills. °· You have a fever. °THIS IS AN EMERGENCY. Do not wait to see if the pain will go away. Get medical help at once. Call your local emergency services (911 in [place] Do not drive yourself to the hospital. °MAKE SURE YOU:  °· Understand these instructions. °· Will watch your condition. °· Will get help right away if you are not doing well or get worse. °Document Released: 07/05/2005 Document Revised: 12/18/2011 Document Reviewed: 04/30/2008 °[employer]® Patient Information ©2014 [employer]   [employer]. ° °

## 2013-11-14 NOTE — ED Provider Notes (Signed)
Medical screening examination/treatment/procedure(s) were performed by non-physician practitioner and as supervising physician I was immediately available for consultation/collaboration.    Lisamarie Coke L Melina Mosteller, MD 11/14/13 1520 

## 2014-03-20 ENCOUNTER — Emergency Department (INDEPENDENT_AMBULATORY_CARE_PROVIDER_SITE_OTHER)
Admission: EM | Admit: 2014-03-20 | Discharge: 2014-03-20 | Disposition: A | Discharge status: Home / Self Care | Payer: Self-pay | Source: Home / Self Care

## 2014-03-20 ENCOUNTER — Encounter (HOSPITAL_COMMUNITY): Payer: Self-pay | Admitting: Emergency Medicine

## 2014-03-20 DIAGNOSIS — J309 Allergic rhinitis, unspecified: Secondary | ICD-10-CM

## 2014-03-20 DIAGNOSIS — J302 Other seasonal allergic rhinitis: Secondary | ICD-10-CM

## 2014-03-20 DIAGNOSIS — J329 Chronic sinusitis, unspecified: Secondary | ICD-10-CM

## 2014-03-20 HISTORY — DX: Unspecified asthma, uncomplicated: J45.909

## 2014-03-20 MED ORDER — AZITHROMYCIN 250 MG PO TABS: ORAL_TABLET | ORAL | Status: DC

## 2014-03-20 MED ORDER — PREDNISONE 50 MG PO TABS: ORAL_TABLET | ORAL | Status: DC

## 2014-03-20 MED ORDER — FLUTICASONE PROPIONATE 50 MCG/ACT NA SUSP: 1.0000 | Freq: Every day | NASAL | Status: DC

## 2014-03-20 NOTE — ED Provider Notes (Signed)
Medical screening examination/treatment/procedure(s) were performed by a resident physician and as supervising physician I was immediately available for consultation/collaboration.  Leslee Homeavid Breslyn Abdo, M.D.  Reuben Likesavid C Kori Goins, MD 03/20/14 2138

## 2014-03-20 NOTE — ED Provider Notes (Signed)
CSN: [id number]     Arrival date & time 03/20/14  [id number] History   None    Chief Complaint  Patient presents with  . Nasal Congestion  . Shortness of Breath   (Consider location/radiation/quality/duration/timing/severity/associated sxs/prior Treatment) HPI  Nasal congestion and cough: started 2 wks ago. getitng worse. Associted w/ nausea, sinus pressure, rinorrhea. Denies fevers, diarrhea. OTC allergy sinus medication w/o benefit. Zyrtec w/o relief. Started wheezing and coughing last night. Pt feels like this may have originally started as allergies.   Itchy gums for 2 wks. Improved w/ zyrtec. Getting worse. Unsure of trigger. No lesions. No discharge.    Past Medical History  Diagnosis Date  . Asthma   . Sickle cell trait    Past Surgical History  Procedure Laterality Date  . Cesarean section     Family History  Problem Relation Age of Onset  . Diabetes Mother   . Hypertension Mother    History  Substance Use Topics  . Smoking status: Never Smoker   . Smokeless tobacco: Not on file  . Alcohol Use: No   OB History   Grav Para Term Preterm Abortions TAB SAB Ect Mult Living                 Review of Systems  Constitutional:       Per HPI  All other systems reviewed and are negative.   Allergies  Penicillins  Home Medications   Prior to Admission medications   Medication Sig Start Date End Date Taking? Authorizing Provider  azithromycin (ZITHROMAX) 250 MG tablet Take 2 pills then 1 pill every day thereafter 03/20/14   Ozella Rocksavid J Merrell, MD  fluticasone [facility](FLONASE) 50 MCG/ACT nasal spray Place 1 spray into both nostrils at bedtime. 03/20/14   Ozella Rocksavid J Merrell, MD  predniSONE (DELTASONE) 50 MG tablet Take daily with breakfast 03/20/14   Ozella Rocksavid J Merrell, MD   BP 127/64  Pulse 85  Temp(Src) 97.7 F (36.5 C) (Oral)  Resp 16  SpO2 100%  LMP 03/17/2014 Physical Exam  Constitutional: She is oriented to person, place, and time. She appears well-developed and  well-nourished. No distress.  HENT:  Head: Normocephalic and atraumatic.  Small papular bumps on gums. No open sores. Nonttp Frontal and maxillary sinuses ttp  Eyes: EOM are normal. Pupils are equal, round, and reactive to light.  Neck: Normal range of motion.  Cardiovascular: Normal rate, normal heart sounds and intact distal pulses.   No murmur heard. Pulmonary/Chest: Effort normal and breath sounds normal.  Abdominal: She exhibits no distension.  Musculoskeletal: Normal range of motion. She exhibits no edema and no tenderness.  Lymphadenopathy:    She has no cervical adenopathy.  Neurological: She is alert and oriented to person, place, and time.  Skin: Skin is warm and dry. She is not diaphoretic.  Psychiatric: She has a normal mood and affect. Her behavior is normal. Judgment and thought content normal.    ED Course  Procedures (including critical care time) Labs Review Labs Reviewed - No data to display  Imaging Review No results found.   MDM   1. Sinusitis   2. Seasonal allergies    Sinusitis: likely initially allergic but now bacterial. Start Azithro (PCN allergy). Nasal saline. Continue zyrted. Start flonase after PO steroids. Ibuprofen PRN relief  Itchy gums: likely allergic in nature. No signs of plaques, gorwth, or overt in fection. Prednisone and zyrtec.   Shelly Flattenavid Merrell, MD Family Medicine PGY-3 03/20/2014, 10:36 AM  Ozella Rocksavid J Merrell, MD 03/20/14 [phone]

## 2014-03-20 NOTE — Discharge Instructions (Signed)
You are likely suffering from a smoldering bacterial infection This will require antibiotics to clear Please start the prednisone for inflammation and itching Please start the flonase in 5 days Please start nasal saline flushing multiple times per day

## 2014-03-20 NOTE — ED Notes (Signed)
Patient complains of sinus pressure and green discharge from nose; complains of asthma like symptoms after congestion and green discharge started; denies f/c.

## 2014-03-23 ENCOUNTER — Encounter (HOSPITAL_COMMUNITY): Payer: Self-pay | Admitting: Emergency Medicine

## 2014-05-05 ENCOUNTER — Ambulatory Visit (HOSPITAL_COMMUNITY)
Admission: RE | Admit: 2014-05-05 | Payer: Medicaid Other | Source: Ambulatory Visit | Admitting: Obstetrics and Gynecology

## 2014-05-05 ENCOUNTER — Encounter (HOSPITAL_COMMUNITY): Admission: RE | Payer: Self-pay | Source: Ambulatory Visit

## 2014-05-05 SURGERY — LEEP (LOOP ELECTROSURGICAL EXCISION PROCEDURE)
Anesthesia: Choice

## 2014-05-20 ENCOUNTER — Telehealth (HOSPITAL_COMMUNITY): Payer: Self-pay | Admitting: *Deleted

## 2014-05-20 NOTE — Telephone Encounter (Signed)
Telephoned patient at home # and advised patient would not be eligible for our BCCCP program since colpo had already been done. She would need to have her Gyn doctors [facility] that I didn't have access to those records. Patient got very irate and hung up the phone.

## 2014-08-10 ENCOUNTER — Encounter (HOSPITAL_COMMUNITY): Payer: Self-pay | Admitting: Emergency Medicine

## 2014-10-09 HISTORY — PX: CERVICAL BIOPSY  W/ LOOP ELECTRODE EXCISION: SUR135

## 2015-06-15 ENCOUNTER — Emergency Department (HOSPITAL_COMMUNITY)
Admission: EM | Admit: 2015-06-15 | Discharge: 2015-06-15 | Disposition: A | Discharge status: Home / Self Care | Payer: Medicaid Other | Attending: Emergency Medicine | Admitting: Emergency Medicine

## 2015-06-15 ENCOUNTER — Encounter (HOSPITAL_COMMUNITY): Payer: Self-pay | Admitting: Emergency Medicine

## 2015-06-15 DIAGNOSIS — Z8659 Personal history of other mental and behavioral disorders: Secondary | ICD-10-CM | POA: Insufficient documentation

## 2015-06-15 DIAGNOSIS — Z793 Long term (current) use of hormonal contraceptives: Secondary | ICD-10-CM | POA: Diagnosis not present

## 2015-06-15 DIAGNOSIS — R202 Paresthesia of skin: Secondary | ICD-10-CM | POA: Diagnosis present

## 2015-06-15 DIAGNOSIS — J45909 Unspecified asthma, uncomplicated: Secondary | ICD-10-CM | POA: Insufficient documentation

## 2015-06-15 DIAGNOSIS — Z862 Personal history of diseases of the blood and blood-forming organs and certain disorders involving the immune mechanism: Secondary | ICD-10-CM | POA: Diagnosis not present

## 2015-06-15 DIAGNOSIS — Z880 Allergy status to penicillin: Secondary | ICD-10-CM | POA: Insufficient documentation

## 2015-06-15 DIAGNOSIS — Z79899 Other long term (current) drug therapy: Secondary | ICD-10-CM | POA: Insufficient documentation

## 2015-06-15 DIAGNOSIS — Z8619 Personal history of other infectious and parasitic diseases: Secondary | ICD-10-CM | POA: Insufficient documentation

## 2015-06-15 DIAGNOSIS — M5410 Radiculopathy, site unspecified: Secondary | ICD-10-CM | POA: Diagnosis not present

## 2015-06-15 DIAGNOSIS — Z87440 Personal history of urinary (tract) infections: Secondary | ICD-10-CM | POA: Insufficient documentation

## 2015-06-15 DIAGNOSIS — E669 Obesity, unspecified: Secondary | ICD-10-CM | POA: Insufficient documentation

## 2015-06-15 LAB — CBC
HCT: 40.3 % (ref 36.0–46.0)
HEMOGLOBIN: 13.6 g/dL (ref 12.0–15.0)
MCH: 27.8 pg (ref 26.0–34.0)
MCHC: 33.7 g/dL (ref 30.0–36.0)
MCV: 82.2 fL (ref 78.0–100.0)
Platelets: 386 10*3/uL (ref 150–400)
RBC: 4.9 MIL/uL (ref 3.87–5.11)
RDW: 11.9 % (ref 11.5–15.5)
WBC: 8.7 10*3/uL (ref 4.0–10.5)

## 2015-06-15 LAB — COMPREHENSIVE METABOLIC PANEL
ALBUMIN: 4.4 g/dL (ref 3.5–5.0)
ALK PHOS: 81 U/L (ref 38–126)
ALT: 12 U/L — ABNORMAL LOW (ref 14–54)
AST: 19 U/L (ref 15–41)
Anion gap: 8 (ref 5–15)
BILIRUBIN TOTAL: 0.8 mg/dL (ref 0.3–1.2)
BUN: 11 mg/dL (ref 6–20)
CALCIUM: 9.7 mg/dL (ref 8.9–10.3)
CO2: 24 mmol/L (ref 22–32)
Chloride: 106 mmol/L (ref 101–111)
Creatinine, Ser: 0.78 mg/dL (ref 0.44–1.00)
GFR calc Af Amer: >60 mL/min (ref >60)
GFR calc non Af Amer: >60 mL/min (ref >60)
GLUCOSE: 95 mg/dL (ref 65–99)
Potassium: 4 mmol/L (ref 3.5–5.1)
Sodium: 138 mmol/L (ref 135–145)
TOTAL PROTEIN: 8.6 g/dL — AB (ref 6.5–8.1)

## 2015-06-15 LAB — PROTIME-INR
INR: 1.1 (ref 0.00–1.49)
Prothrombin Time: 14.4 seconds (ref 11.6–15.2)

## 2015-06-15 LAB — I-STAT CHEM 8, ED
BUN: 13 mg/dL (ref 6–20)
CALCIUM ION: 1.22 mmol/L (ref 1.12–1.23)
CHLORIDE: 105 mmol/L (ref 101–111)
Creatinine, Ser: 0.7 mg/dL (ref 0.44–1.00)
Glucose, Bld: 94 mg/dL (ref 65–99)
HCT: 47 % — ABNORMAL HIGH (ref 36.0–46.0)
Hemoglobin: 16 g/dL — ABNORMAL HIGH (ref 12.0–15.0)
POTASSIUM: 4 mmol/L (ref 3.5–5.1)
SODIUM: 141 mmol/L (ref 135–145)
TCO2: 24 mmol/L (ref 0–100)

## 2015-06-15 LAB — DIFFERENTIAL
BASOS ABS: 0 10*3/uL (ref 0.0–0.1)
Basophils Relative: 1 % (ref 0–1)
Eosinophils Absolute: 0.2 10*3/uL (ref 0.0–0.7)
Eosinophils Relative: 2 % (ref 0–5)
LYMPHS ABS: 2.4 10*3/uL (ref 0.7–4.0)
LYMPHS PCT: 27 % (ref 12–46)
Monocytes Absolute: 0.4 10*3/uL (ref 0.1–1.0)
Monocytes Relative: 5 % (ref 3–12)
NEUTROS ABS: 5.7 10*3/uL (ref 1.7–7.7)
NEUTROS PCT: 65 % (ref 43–77)

## 2015-06-15 LAB — CBG MONITORING, ED: Glucose-Capillary: 88 mg/dL (ref 65–99)

## 2015-06-15 LAB — APTT: APTT: 31 s (ref 24–37)

## 2015-06-15 LAB — I-STAT TROPONIN, ED: Troponin i, poc: 0 ng/mL (ref 0.00–0.08)

## 2015-06-15 MED ORDER — IBUPROFEN 600 MG PO TABS: 600.0000 mg | ORAL_TABLET | Freq: Four times a day (QID) | ORAL | Status: DC | PRN

## 2015-06-15 NOTE — Discharge Instructions (Signed)
There does not appear to be an emergent cause for your left arm pain. Your likely experiencing a radiculopathy. Please take your medications as prescribed. Follow-up with your doctor for reevaluation in one week. Return to ED for worsening symptoms.  Cervical Radiculopathy Cervical radiculopathy happens when a nerve in the neck is pinched or bruised by a slipped (herniated) disk or by arthritic changes in the bones of the cervical spine. This can occur due to an injury or as part of the normal aging process. Pressure on the cervical nerves can cause pain or numbness that runs from your neck all the way down into your arm and fingers. CAUSES  There are many possible causes, including:  Injury.  Muscle tightness in the neck from overuse.  Swollen, painful joints (arthritis).  Breakdown or degeneration in the bones and joints of the spine (spondylosis) due to aging.  Bone spurs that may develop near the cervical nerves. SYMPTOMS  Symptoms include pain, weakness, or numbness in the affected arm and hand. Pain can be severe or irritating. Symptoms may be worse when extending or turning the neck. DIAGNOSIS  Your caregiver will ask about your symptoms and do a physical exam. He or she may test your strength and reflexes. X-rays, CT scans, and MRI scans may be needed in cases of injury or if the symptoms do not go away after a period of time. Electromyography (EMG) or nerve conduction testing may be done to study how your nerves and muscles are working. TREATMENT  Your caregiver may recommend certain exercises to help relieve your symptoms. Cervical radiculopathy can, and often does, get better with time and treatment. If your problems continue, treatment options may include:  Wearing a soft collar for short periods of time.  Physical therapy to strengthen the neck muscles.  Medicines, such as nonsteroidal anti-inflammatory drugs (NSAIDs), oral corticosteroids, or spinal injections.  Surgery.  Different types of surgery may be done depending on the cause of your problems. HOME CARE INSTRUCTIONS   Put ice on the affected area.  Put ice in a plastic bag.  Place a towel between your skin and the bag.  Leave the ice on for 15-20 minutes, 03-04 times a day or as directed by your caregiver.  If ice does not help, you can try using heat. Take a warm shower or bath, or use a hot water bottle as directed by your caregiver.  You may try a gentle neck and shoulder massage.  Use a flat pillow when you sleep.  Only take over-the-counter or prescription medicines for pain, discomfort, or fever as directed by your caregiver.  If physical therapy was prescribed, follow your caregiver's directions.  If a soft collar was prescribed, use it as directed. SEEK IMMEDIATE MEDICAL CARE IF:   Your pain gets much worse and cannot be controlled with medicines.  You have weakness or numbness in your hand, arm, face, or leg.  You have a high fever or a stiff, rigid neck.  You lose bowel or bladder control (incontinence).  You have trouble with walking, balance, or speaking. MAKE SURE YOU:   Understand these instructions.  Will watch your condition.  Will get help right away if you are not doing well or get worse. Document Released: 06/20/2001 Document Revised: 12/18/2011 Document Reviewed: 05/09/2011 [facility] Patient Information 2015 [place], [place]. This information is not intended to replace advice given to you by your health care provider. Make sure you discuss any questions you have with your health care provider.

## 2015-06-15 NOTE — ED Provider Notes (Signed)
CSN: [id number]     Arrival date & time 06/15/15  6295 History   First MD Initiated Contact with Patient 06/15/15 1158     Chief Complaint  Patient presents with  . stroke like symptoms      (Consider location/radiation/quality/duration/timing/severity/associated sxs/prior Treatment) HPI Joanna Padilla is a 29 y.o. female with a history of arrhythmia, comes in for evaluation of arm pain. Patient states intermittently over the past 3 weeks she has noticed arm tingling and pain in her left arm "it just feels tight". She reports sometimes the sensation will travel up the side of her left neck, but both symptoms usually resolve on their own. Symptoms sometimes worse lying on her left side or with certain movements of her arm.  Additionally, She reports associated sharp, fleeting chest pains across her anterior chest. The sensations are nonexertional. Most recently experienced the sensation while laughing. She denies any shortness of breath, nausea or vomiting, diaphoresis. She denies any other numbness, weakness, recent injuries or illnesses, fevers or chills,, gait difficulties, difficulty swallowing or with phonation. Nonsmoker Patient does report that she is increased stress in her life, raising 2 children by herself while trying to complete her Masters degree. She believes her symptoms are possibly due to this problem  Past Medical History  Diagnosis Date  . Acute bronchitis   . Unspecified asthma(493.90)   . Obesity, unspecified   . Seasonal allergies   . Chlamydia infection   . Urinary tract infection   . Infection     UTI  . Abnormal Pap smear   . Anxiety   . Asthma   . Sickle cell trait    Past Surgical History  Procedure Laterality Date  . Wisdom tooth extraction    . Cesarean section    . Abdominal surgery      c-section  . Cesarean section     Family History  Problem Relation Age of Onset  . Hyperlipidemia Mother   . Transient ischemic attack Mother   . Mental illness  Mother   . Diabetes Maternal Grandmother   . Hypertension Maternal Grandmother   . Diabetes Mother   . Hypertension Mother    Social History  Substance Use Topics  . Smoking status: Never Smoker   . Smokeless tobacco: None  . Alcohol Use: No   OB History    Gravida Para Term Preterm AB TAB SAB Ectopic Multiple Living   Review of Systems A 10 point review of systems was completed and was negative except for pertinent positives and negatives as mentioned in the history of present illness     Allergies  Penicillins and Penicillins  Home Medications   Prior to Admission medications   Medication Sig Start Date End Date Taking? Authorizing Provider  Norethindrone-Ethinyl Estradiol-Fe Biphas (LO LOESTRIN FE) 1 MG-10 MCG / 10 MCG tablet Take 1 tablet by mouth daily.   Yes Historical Provider, MD  albuterol (PROVENTIL HFA;VENTOLIN HFA) 108 (90 BASE) MCG/ACT inhaler Inhale 1-2 puffs into the lungs every 6 (six) hours as needed for wheezing or shortness of breath.    Historical Provider, MD  ibuprofen (ADVIL,MOTRIN) 600 MG tablet Take 1 tablet (600 mg total) by mouth every 6 (six) hours as needed. 06/15/15   Joycie Peek, PA-C   BP 102/48 mmHg  Pulse 61  Temp(Src) 97.9 F (36.6 C) (Oral)  Resp 15  Ht  (1.6 m)  Wt 194  lb (87.998 kg)  BMI 34.37 kg/m2  SpO2 98%  LMP 06/09/2015 Physical Exam  Constitutional: She is oriented to person, place, and time. She appears well-developed and well-nourished.  HENT:  Head: Normocephalic and atraumatic.  Mouth/Throat: Oropharynx is clear and moist.  Eyes: Conjunctivae are normal. Pupils are equal, round, and reactive to light. Right eye exhibits no discharge. Left eye exhibits no discharge. No scleral icterus.  Neck: Neck supple.  Cardiovascular: Normal rate, regular rhythm and normal heart sounds.   Pulmonary/Chest: Effort normal and breath sounds normal. No respiratory distress. She has no wheezes. She has no rales.   Abdominal: Soft. There is no tenderness.  Musculoskeletal: She exhibits no tenderness.  Patient maintains full active range of motion of cervical spine with no bony tenderness or neck tenderness. Left upper extremity is without any focal tenderness, edema, erythema. Muscle compartments are soft.   Neurological: She is alert and oriented to person, place, and time.  Cranial Nerves II-XII grossly intact. Moves all extremities without ataxia. Motor and sensation 5/5 in all 4 extremities. Grip strength equal and intact bilaterally. Completes finger to nose coordination movements without difficulty. Extraocular movements intact without nystagmus. Baseline per mom in the room.  Skin: Skin is warm and dry. No rash noted.  Psychiatric: She has a normal mood and affect.  Nursing note and vitals reviewed.   ED Course  Procedures (including critical care time) Labs Review Labs Reviewed  COMPREHENSIVE METABOLIC PANEL - Abnormal; Notable for the following:    Total Protein 8.6 (*)    ALT 12 (*)    All other components within normal limits  I-STAT CHEM 8, ED - Abnormal; Notable for the following:    Hemoglobin 16.0 (*)    HCT 47.0 (*)    All other components within normal limits  PROTIME-INR  APTT  CBC  DIFFERENTIAL  I-STAT TROPOININ, ED  CBG MONITORING, ED    Imaging Review No results found. I have personally reviewed and evaluated these images and lab results as part of my medical decision-making.   EKG Interpretation None     Meds given in ED:  Medications - No data to display  Discharge Medication List as of 06/15/2015  1:31 PM     Filed Vitals:   06/15/15 1300 06/15/15 1315 06/15/15 1330 06/15/15 1340  BP: 112/58 109/55 102/48   Pulse:  57 61   Temp:    97.9 F (36.6 C)  TempSrc:    Oral  Resp: 15     Height:      Weight:      SpO2:  99% 98%     MDM  Vitals stable - WNL -afebrile Pt resting comfortably in ED. PE--normal neuro exam with no focal  deficits. Labwork-labs are baseline and noncontributory.  DDX--patient's symptoms most consistent with cervical radiculopathy. We'll treat with anti-inflammatories. Low suspicion for other central lesion. Patient has no risk factors for stroke. Chest pain is atypical, fleeting and not consistent with ACS and is not concerning.  I discussed all relevant lab findings and imaging results with pt and they verbalized understanding. Discussed f/u with PCP within 48 hrs and return precautions, pt very amenable to plan.   Final diagnoses:  Radiculopathy of arm        Joycie Peek, PA-C 06/15/15 1616  Gilda Crease, MD 06/16/15 [phone]

## 2015-06-15 NOTE — ED Notes (Signed)
Patient states started having L facial numbness and L arm numbness x 2 weeks, but went away.   Patient states that it started happening again x 2 days ago.   Patient states she had went to health department x 2 weeks ago, and that they referred her here.  Patient states that she decided not to come then.   No neuro deficits noticed at triage.

## 2015-06-15 NOTE — ED Notes (Signed)
Unable to get blood x 2.   Patient refused tech to stick.  Phlebotomy on the way.

## 2015-06-17 ENCOUNTER — Encounter: Payer: Self-pay | Admitting: *Deleted

## 2015-06-22 ENCOUNTER — Encounter: Payer: Self-pay | Admitting: Obstetrics & Gynecology

## 2015-07-10 HISTORY — PX: LEEP: SHX91

## 2015-07-14 ENCOUNTER — Other Ambulatory Visit (HOSPITAL_COMMUNITY)
Admission: RE | Admit: 2015-07-14 | Discharge: 2015-07-14 | Disposition: A | Discharge status: Home / Self Care | Payer: Medicaid Other | Source: Ambulatory Visit | Attending: Obstetrics & Gynecology | Admitting: Obstetrics & Gynecology

## 2015-07-14 ENCOUNTER — Ambulatory Visit (INDEPENDENT_AMBULATORY_CARE_PROVIDER_SITE_OTHER): Payer: Medicaid Other | Admitting: Obstetrics & Gynecology

## 2015-07-14 ENCOUNTER — Encounter: Payer: Self-pay | Admitting: Obstetrics & Gynecology

## 2015-07-14 VITALS — BP 138/68 | HR 65 | Temp 98.7°F | Wt 194.8 lb

## 2015-07-14 DIAGNOSIS — R87619 Unspecified abnormal cytological findings in specimens from cervix uteri: Secondary | ICD-10-CM | POA: Insufficient documentation

## 2015-07-14 DIAGNOSIS — Z3202 Encounter for pregnancy test, result negative: Secondary | ICD-10-CM

## 2015-07-14 DIAGNOSIS — N871 Moderate cervical dysplasia: Secondary | ICD-10-CM

## 2015-07-14 DIAGNOSIS — D069 Carcinoma in situ of cervix, unspecified: Secondary | ICD-10-CM | POA: Insufficient documentation

## 2015-07-14 LAB — POCT PREGNANCY, URINE: PREG TEST UR: NEGATIVE

## 2015-07-14 NOTE — Addendum Note (Signed)
Addended by: Jaynie Collins A on: 07/14/2015 04:07 PM   Modules accepted: Orders

## 2015-07-14 NOTE — Progress Notes (Signed)
   GYNECOLOGY CLINIC PROCEDURE NOTE  Joanna Padilla is a 29 y.o. [id number] here for LEEP. No GYN concerns. Pap smear and colposcopy reviewed.    Pap HGSIL Colpo Biopsy CINII   ECC CIN II  Risks, benefits, alternatives, and limitations of procedure explained to patient, including pain, bleeding, infection, failure to remove abnormal tissue and failure to cure dysplasia, need for repeat procedures, damage to pelvic organs, cervical incompetence.  Role of HPV,cervical dysplasia and need for close followup was empasized. Informed written consent was obtained. All questions were answered. Time out performed. Urine pregnancy test was negative.  Procedure: The patient was placed in lithotomy position and the bivalved coated speculum was placed in the patient's vagina. A grounding pad placed on the patient. Lugol's solution was applied to the cervix and areas of decreased uptake were noted around the transformation zone.   Local anesthesia was administered via an intracervical block using 10cc of 2% Lidocaine with epinephrine. The suction was turned on and the Medium 1X Fisher Cone Biopsy Excisor on 81 Watts of cutting current was used to excise the area of decreased uptake and excise the entire transformation zone. Excellent hemostasis was achieved using roller ball coagulation set at 60 Watts coagulation current. Monsel's solution was then applied and the speculum was removed from the vagina. Specimens were sent to pathology.  The patient tolerated the procedure well. Post-operative instructions given to patient, including instruction to seek medical attention for persistent bright red bleeding, fever, abdominal/pelvic pain, dysuria, nausea or vomiting. She was also told about the possibility of having copious yellow to black tinged discharge for weeks. She was counseled to avoid anything in the vagina (sex/douching/tampons) for 3 weeks. She has a 4 week post-operative check to assess wound healing, review  results and discuss further management.    Jaynie Collins, MD, FACOG Attending Obstetrician & Gynecologist, [facility] and Center for [facility]

## 2015-07-14 NOTE — Patient Instructions (Signed)
Loop Electrosurgical Excision Procedure, Care After Refer to this sheet in the next few weeks. These instructions provide you with information on caring for yourself after your procedure. Your caregiver may also give you more specific instructions. Your treatment has been planned according to current medical practices, but problems sometimes occur. Call your caregiver if you have any problems or questions after your procedure. HOME CARE INSTRUCTIONS   Do not use tampons, douche, or have sexual intercourse for 3 weeks or as directed by your caregiver.  Begin normal activities if you have no or minimal cramping or bleeding, unless directed otherwise by your caregiver.  Take your temperature if you feel sick. Write down your temperature on paper, and tell your caregiver if you have a fever.  Take all medicines as directed by your caregiver.  Keep all your follow-up appointments and Pap tests as directed by your caregiver. SEEK IMMEDIATE MEDICAL CARE IF:   You have bleeding that is heavier or longer than a normal menstrual cycle.  You have bleeding that is bright red.  You have blood clots.  You have a fever.  You have increasing cramps or pain not relieved by medicine.  You develop abdominal pain that does not seem to be related to the same area of earlier cramping and pain.  You are lightheaded, unusually weak, or faint.  You develop painful or bloody urination.  You develop a bad smelling vaginal discharge. MAKE SURE YOU:  Understand these instructions.  Will watch your condition.  Will get help right away if you are not doing well or get worse.   This information is not intended to replace advice given to you by your health care provider. Make sure you discuss any questions you have with your health care provider.   Document Released: 06/08/2011 Document Revised: 10/16/2014 Document Reviewed: 06/08/2011 Elsevier Interactive Patient Education 2016 [employer]  

## 2015-07-18 ENCOUNTER — Encounter: Payer: Self-pay | Admitting: Obstetrics & Gynecology

## 2015-07-20 ENCOUNTER — Telehealth: Payer: Self-pay | Admitting: *Deleted

## 2015-07-20 NOTE — Telephone Encounter (Signed)
Called patient per Dr. Macon Large, informed of LEEP result, need to follow up in one month for post LEEP appointment. Need repeat PAP and ECC/LEEP in 4-6 months. Patient voiced understanding. She is calling for appointment, will forward this to front desk as well.

## 2015-08-25 ENCOUNTER — Encounter: Payer: Self-pay | Admitting: Obstetrics & Gynecology

## 2015-08-25 ENCOUNTER — Ambulatory Visit (INDEPENDENT_AMBULATORY_CARE_PROVIDER_SITE_OTHER): Payer: Medicaid Other | Admitting: Obstetrics & Gynecology

## 2015-08-25 VITALS — BP 107/50 | HR 56 | Temp 98.9°F | Resp 20 | Ht 63.0 in | Wt 196.3 lb

## 2015-08-25 DIAGNOSIS — D069 Carcinoma in situ of cervix, unspecified: Secondary | ICD-10-CM

## 2015-08-25 DIAGNOSIS — Z9889 Other specified postprocedural states: Secondary | ICD-10-CM

## 2015-08-25 NOTE — Patient Instructions (Signed)
Return to clinic for any scheduled appointments or for any gynecologic concerns as needed.   

## 2015-08-25 NOTE — Progress Notes (Signed)
   CLINIC ENCOUNTER NOTE  History:  29 y.o. [id number] here today for post LEEP evaluation; LEP done for CIN II on 07/14/15.  No problems, currently on period. She denies any abnormal vaginal discharge, bleeding, pelvic pain or other concerns.   Past Medical History  Diagnosis Date  . Acute bronchitis   . Unspecified asthma(493.90)   . Obesity, unspecified   . Seasonal allergies   . Chlamydia infection   . Urinary tract infection   . Infection     UTI  . Abnormal Pap smear   . Anxiety   . Asthma   . Sickle cell trait [facility](HCC)     Past Surgical History  Procedure Laterality Date  . Wisdom tooth extraction    . Cesarean section    . Abdominal surgery      c-section  . Cesarean section    . Leep  07/2015    The following portions of the patient's history were reviewed and updated as appropriate: allergies, current medications, past family history, past medical history, past social history, past surgical history and problem list.   Health Maintenance:  LGSIL pap on 05/2015.    Review of Systems:  Pertinent items noted in HPI and remainder of comprehensive ROS otherwise negative.  Objective:  Physical Exam BP 107/50 mmHg  Pulse 56  Temp(Src) 98.9 F (37.2 C) (Oral)  Resp 20  Ht 5\' 3"  (1.6 m)  Wt 196 lb 4.8 oz (89.041 kg)  BMI 34.78 kg/m2  LMP 08/24/2015 (Exact Date) CONSTITUTIONAL: Well-developed, well-nourished female in no acute distress.  HENT:  Normocephalic, atraumatic. External right and left ear normal. Oropharynx is clear and moist EYES: Conjunctivae and EOM are normal. Pupils are equal, round, and reactive to light. No scleral icterus.  NECK: Normal range of motion, supple, no masses SKIN: Skin is warm and dry. No rash noted. Not diaphoretic. No erythema. No pallor. NEUROLGIC: Alert and oriented to person, place, and time. Normal reflexes, muscle tone coordination. No cranial nerve deficit noted. PSYCHIATRIC: Normal mood and affect. Normal behavior. Normal judgment  and thought content. CARDIOVASCULAR: Normal heart rate noted RESPIRATORY: Effort and breath sounds normal, no problems with respiration noted ABDOMEN: Soft, no distention noted.   PELVIC: Normal appearing external genitalia; normal appearing vaginal mucosa and well-healed cervix. Bloody discharge noted due to current period.   MUSCULOSKELETAL: Normal range of motion. No edema noted.  LEEP pathology 07/14/15: CIN III on  with positive ectocervical margins   Assessment & Plan:  Severe dysplasia of cervix (CIN III) Needs repeat pap smear and ECC vs repeat LEEP in 4-6 months due to positive margins Patient was told to call and make appointment. Routine preventative health maintenance measures emphasized. Please refer to After Visit Summary for other counseling recommendations.   Return for Pap smear in 01/2016.    Joanna Padilla  Joanna Bosler, MD, FACOG Attending Obstetrician & Gynecologist, [facility]

## 2015-12-15 ENCOUNTER — Emergency Department (HOSPITAL_BASED_OUTPATIENT_CLINIC_OR_DEPARTMENT_OTHER)
Admission: EM | Admit: 2015-12-15 | Discharge: 2015-12-15 | Disposition: A | Discharge status: Home / Self Care | Payer: Medicaid Other | Attending: Emergency Medicine | Admitting: Emergency Medicine

## 2015-12-15 ENCOUNTER — Encounter (HOSPITAL_BASED_OUTPATIENT_CLINIC_OR_DEPARTMENT_OTHER): Payer: Self-pay | Admitting: Emergency Medicine

## 2015-12-15 ENCOUNTER — Emergency Department (HOSPITAL_BASED_OUTPATIENT_CLINIC_OR_DEPARTMENT_OTHER): Payer: Medicaid Other

## 2015-12-15 DIAGNOSIS — Z862 Personal history of diseases of the blood and blood-forming organs and certain disorders involving the immune mechanism: Secondary | ICD-10-CM | POA: Diagnosis not present

## 2015-12-15 DIAGNOSIS — J040 Acute laryngitis: Secondary | ICD-10-CM | POA: Insufficient documentation

## 2015-12-15 DIAGNOSIS — Z79818 Long term (current) use of other agents affecting estrogen receptors and estrogen levels: Secondary | ICD-10-CM | POA: Diagnosis not present

## 2015-12-15 DIAGNOSIS — J45901 Unspecified asthma with (acute) exacerbation: Secondary | ICD-10-CM

## 2015-12-15 DIAGNOSIS — Z880 Allergy status to penicillin: Secondary | ICD-10-CM | POA: Diagnosis not present

## 2015-12-15 DIAGNOSIS — Z8659 Personal history of other mental and behavioral disorders: Secondary | ICD-10-CM | POA: Insufficient documentation

## 2015-12-15 DIAGNOSIS — R0602 Shortness of breath: Secondary | ICD-10-CM | POA: Diagnosis present

## 2015-12-15 DIAGNOSIS — Z8619 Personal history of other infectious and parasitic diseases: Secondary | ICD-10-CM | POA: Diagnosis not present

## 2015-12-15 DIAGNOSIS — Z87440 Personal history of urinary (tract) infections: Secondary | ICD-10-CM | POA: Diagnosis not present

## 2015-12-15 DIAGNOSIS — E669 Obesity, unspecified: Secondary | ICD-10-CM | POA: Diagnosis not present

## 2015-12-15 DIAGNOSIS — Z79899 Other long term (current) drug therapy: Secondary | ICD-10-CM | POA: Diagnosis not present

## 2015-12-15 MED ORDER — PREDNISONE 20 MG PO TABS: ORAL_TABLET | ORAL | Status: DC

## 2015-12-15 MED ORDER — PREDNISONE 50 MG PO TABS
60.0000 mg | ORAL_TABLET | Freq: Once | ORAL | Status: AC
  Administered 2015-12-15: 60 mg via ORAL
  Filled 2015-12-15: qty 1

## 2015-12-15 MED FILL — predniSONE 20 MG TABS: 20 | 6 days supply | Qty: 12 | Fill #0

## 2015-12-15 NOTE — ED Provider Notes (Signed)
CSN: [id number]     Arrival date & time 12/15/15  1315 History   First MD Initiated Contact with Patient 12/15/15 1502     Chief Complaint  Patient presents with  . Shortness of Breath     (Consider location/radiation/quality/duration/timing/severity/associated sxs/prior Treatment) The history is provided by the patient.  Joanna Padilla is a 30 y.o. female hx of asthma, bronchitis, sickle cell trait here with shortness of breath. Patient has been intermittently short of breath for the last month or so. Patient states that sometimes her voice is hoarse. She finished a course of Z-Pak several days ago and is on cough medicine. 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 She thought she had a panic attack but calmed down afterwards still had some trouble breathing. He is one albuterol treatment that didn't improve her symptoms. Denies any wheezing or fevers. Has nonproductive cough. Denies hx of acute chest and no hx of sickle cell pain crisis.    Past Medical History  Diagnosis Date  . Acute bronchitis   . Unspecified asthma(493.90)   . Obesity, unspecified   . Seasonal allergies   . Chlamydia infection   . Urinary tract infection   . Infection     UTI  . Abnormal Pap smear   . Anxiety   . Asthma   . Sickle cell trait [facility])    Past Surgical History  Procedure Laterality Date  . Wisdom tooth extraction    . Cesarean section    . Abdominal surgery      c-section  . Cesarean section    . Leep  07/2015   Family History  Problem Relation Age of Onset  . Hyperlipidemia Mother   . Transient ischemic attack Mother   . Mental illness Mother   . Diabetes Mother   . Hypertension Mother   . Congestive Heart Failure Mother   . Diabetes Maternal Grandmother   . Hypertension Maternal Grandmother    Social History  Substance Use Topics  . Smoking status: Never Smoker   . Smokeless tobacco: None  . Alcohol Use: No   OB History    Gravida Para Term Preterm AB TAB SAB Ectopic Multiple Living   Review of Systems  Respiratory: Positive for shortness of breath.   All other systems reviewed and are negative.     Allergies  Penicillins and Penicillins  Home Medications   Prior to Admission medications   Medication Sig Start Date End Date Taking? Authorizing Provider  Norethindrone-Ethinyl Estradiol-Fe Biphas (LO LOESTRIN FE) 1 MG-10 MCG / 10 MCG tablet Take 1 tablet by mouth daily.   Yes Historical Provider, MD  albuterol (PROVENTIL HFA;VENTOLIN HFA) 108 (90 BASE) MCG/ACT inhaler Inhale 1-2 puffs into the lungs every 6 (six) hours as needed for wheezing or shortness of breath.    Historical Provider, MD  ibuprofen (ADVIL,MOTRIN) 600 MG tablet Take 1 tablet (600 mg total) by mouth every 6 (six) hours as needed. Patient not taking: Reported on 08/25/2015 06/15/15   Joycie Peek, PA-C   BP 114/69 mmHg  Pulse 68  Temp(Src) 98.4 F (36.9 C) (Oral)  Resp 22  Ht  (1.6 m)  Wt 194 lb (87.998 kg)  BMI 34.37 kg/m2  SpO2 99%  LMP 12/15/2015 Physical Exam  Constitutional: She is oriented to person, place, and time. She appears well-developed and well-nourished.  Voice is hoarse   HENT:  Head: Normocephalic.  Mouth/Throat: Oropharynx is clear and moist.  OP clear   Eyes: Conjunctivae and EOM are normal. Pupils are equal, round, and reactive to light.  Neck: Normal range of motion. Neck supple.  No stridor   Cardiovascular: Normal rate, regular rhythm and normal heart sounds.   Pulmonary/Chest: Effort normal.  Diminished, no wheezing   Abdominal: Soft. Bowel sounds are normal. She exhibits no distension. There is no tenderness. There is no rebound.  Musculoskeletal: Normal range of motion. She exhibits no edema or tenderness.  Neurological: She is alert and oriented to person, place, and time.  Skin: Skin is warm and dry.  Psychiatric: She has a normal mood and affect. Her behavior is  normal. Judgment and thought content normal.  Nursing note and vitals reviewed.   ED Course  Procedures (including critical care time) Labs Review Labs Reviewed - No data to display  Imaging Review Dg Chest 2 View  12/15/2015  CLINICAL DATA:  Shortness of breath today. Bilateral lower extremity weakness. Initial encounter. EXAM: CHEST  2 VIEW COMPARISON:  PA and lateral chest 11/11/2013. FINDINGS: The lungs are clear. Heart size is normal. There is no pneumothorax or pleural effusion. No bony abnormality. IMPRESSION: Negative chest. Electronically Signed   By: Drusilla Kannerhomas  Dalessio M.D.   On: 12/15/2015 13:54   I have personally reviewed and evaluated these images and lab results as part of my medical decision-making.   EKG Interpretation None      MDM   Final diagnoses:  None   Joanna Padilla is a 30 y.o. female here with cough, SOB. Has laryngitis vs mild asthma exacerbation. No wheezing or stridor. Not tachy or hypoxic. Well appearing. Just finished zpack and cxr clear. Will give course of steroids. Has albuterol at home.      Richardean Canalavid H Yao, MD 12/15/15 [phone]

## 2015-12-15 NOTE — Discharge Instructions (Signed)
Take prednisone as prescribed.   Use albuterol every 4 hrs as needed.  Rest for 2 days.   See your doctor  Return to ER if you have trouble breathing, worse wheezing, cough, fever.

## 2015-12-15 NOTE — ED Notes (Addendum)
At triage pt states she became out of breath while at work. She took a nebulizer at 11 am with no relief. States a hx of panic attacks and asthma. Speaking in clear/full sentences at triage.

## 2015-12-15 NOTE — ED Notes (Signed)
MD at bedside. 

## 2016-09-17 ENCOUNTER — Encounter (HOSPITAL_COMMUNITY): Payer: Self-pay | Admitting: *Deleted

## 2016-09-17 ENCOUNTER — Inpatient Hospital Stay (HOSPITAL_COMMUNITY): Payer: Medicaid Other

## 2016-09-17 ENCOUNTER — Inpatient Hospital Stay (HOSPITAL_COMMUNITY)
Admission: AD | Admit: 2016-09-17 | Discharge: 2016-09-17 | Disposition: A | Discharge status: Home / Self Care | Payer: Medicaid Other | Source: Ambulatory Visit | Attending: Obstetrics & Gynecology | Admitting: Obstetrics & Gynecology

## 2016-09-17 DIAGNOSIS — R109 Unspecified abdominal pain: Secondary | ICD-10-CM

## 2016-09-17 DIAGNOSIS — B9689 Other specified bacterial agents as the cause of diseases classified elsewhere: Secondary | ICD-10-CM | POA: Diagnosis not present

## 2016-09-17 DIAGNOSIS — O3680X0 Pregnancy with inconclusive fetal viability, not applicable or unspecified: Secondary | ICD-10-CM

## 2016-09-17 DIAGNOSIS — O26899 Other specified pregnancy related conditions, unspecified trimester: Secondary | ICD-10-CM

## 2016-09-17 DIAGNOSIS — O26891 Other specified pregnancy related conditions, first trimester: Secondary | ICD-10-CM | POA: Diagnosis not present

## 2016-09-17 DIAGNOSIS — O23591 Infection of other part of genital tract in pregnancy, first trimester: Secondary | ICD-10-CM | POA: Insufficient documentation

## 2016-09-17 DIAGNOSIS — Z880 Allergy status to penicillin: Secondary | ICD-10-CM | POA: Diagnosis not present

## 2016-09-17 DIAGNOSIS — Z3A01 Less than 8 weeks gestation of pregnancy: Secondary | ICD-10-CM

## 2016-09-17 DIAGNOSIS — N760 Acute vaginitis: Secondary | ICD-10-CM

## 2016-09-17 LAB — URINALYSIS, ROUTINE W REFLEX MICROSCOPIC
Bilirubin Urine: NEGATIVE
GLUCOSE, UA: NEGATIVE mg/dL
Hgb urine dipstick: NEGATIVE
KETONES UR: 20 mg/dL — AB
LEUKOCYTES UA: NEGATIVE
NITRITE: NEGATIVE
PROTEIN: NEGATIVE mg/dL
Specific Gravity, Urine: 1.014 (ref 1.005–1.030)
pH: 6 (ref 5.0–8.0)

## 2016-09-17 LAB — WET PREP, GENITAL
Sperm: NONE SEEN
Trich, Wet Prep: NONE SEEN
Yeast Wet Prep HPF POC: NONE SEEN

## 2016-09-17 LAB — HCG, QUANTITATIVE, PREGNANCY: hCG, Beta Chain, Quant, S: 5395 m[IU]/mL — ABNORMAL HIGH (ref <5)

## 2016-09-17 LAB — POCT PREGNANCY, URINE: Preg Test, Ur: POSITIVE — AB

## 2016-09-17 MED ORDER — METRONIDAZOLE 500 MG PO TABS: 500.0000 mg | ORAL_TABLET | Freq: Two times a day (BID) | ORAL | 0 refills | Status: DC

## 2016-09-17 NOTE — MAU Note (Signed)
Positive pregnancy test on Friday, cramping since Friday morning, has not stopped, no dysuria, no vagina bleeding. LMP towards the end of October

## 2016-09-17 NOTE — Discharge Instructions (Signed)

## 2016-09-17 NOTE — MAU Provider Note (Signed)
History     CSN: [id number]  Arrival date and time: 09/17/16 1321   First Provider Initiated Contact with Patient 09/17/16 1350      Chief Complaint  Patient presents with  . Abdominal Cramping   HPI Ms. Desma Mcgregorakiea V Beam is a 30 y.o. G3P2002 at 4253w0d by unsure LMP who presents to MAU today with complaint of lower abdominal cramping x 3 days. The patient states pain comes and goes. She rates pain at 5/10 now. She has not taken anything for pain. She denies vaginal bleeding, discharge, UTI symptoms or fever. She has had intermittent nausea with only one episode of vomiting. She denies diarrhea or constipation. She states +HPT.    OB History    Gravida Para Term Preterm AB Living   3 2 2     2    SAB TAB Ectopic Multiple Live Births           2      Past Medical History:  Diagnosis Date  . Abnormal Pap smear   . Acute bronchitis   . Anxiety   . Asthma   . Chlamydia infection   . Infection    UTI  . Obesity, unspecified   . Seasonal allergies   . Sickle cell trait (HCC)   . Unspecified asthma(493.90)   . Urinary tract infection     Past Surgical History:  Procedure Laterality Date  . ABDOMINAL SURGERY     c-section  . CESAREAN SECTION    . CESAREAN SECTION    . LEEP  07/2015  . WISDOM TOOTH EXTRACTION      Family History  Problem Relation Age of Onset  . Hyperlipidemia Mother   . Transient ischemic attack Mother   . Mental illness Mother   . Diabetes Mother   . Hypertension Mother   . Congestive Heart Failure Mother   . Diabetes Maternal Grandmother   . Hypertension Maternal Grandmother     Social History  Substance Use Topics  . Smoking status: Never Smoker  . Smokeless tobacco: Never Used  . Alcohol use No    Allergies:  Allergies  Allergen Reactions  . Penicillins Anaphylaxis and Hives    Has patient had a PCN reaction causing immediate rash, facial/tongue/throat swelling, SOB or lightheadedness with hypotension: Yes Has patient had a PCN  reaction causing severe rash involving mucus membranes or skin necrosis: No Has patient had a PCN reaction that required hospitalization No Has patient had a PCN reaction occurring within the last 10 years: No If all of the above answers are "NO", then may proceed with Cephalosporin use.   Marland Kitchen. Penicillins Hives    No prescriptions prior to admission.    Review of Systems  Constitutional: Negative for fever and malaise/fatigue.  Gastrointestinal: Positive for abdominal pain and nausea. Negative for constipation, diarrhea and vomiting.  Genitourinary: Negative for dysuria, frequency and urgency.       Neg - vaginal bleeding, discharge   Physical Exam   Blood pressure 118/69, pulse 68, temperature 98.3 F (36.8 C), temperature source Oral, resp. rate 18, height 5\' 3"  (1.6 m), weight 193 lb (87.5 kg), last menstrual period 08/06/2016.  Physical Exam  Nursing note and vitals reviewed. Constitutional: She is oriented to person, place, and time. She appears well-developed and well-nourished. No distress.  HENT:  Head: Normocephalic and atraumatic.  Cardiovascular: Normal rate.   Respiratory: Effort normal.  GI: Soft. She exhibits no distension and no mass. There is no tenderness. There is no  rebound and no guarding.  Genitourinary: Uterus is not enlarged and not tender. Cervix exhibits no motion tenderness. Right adnexum displays no mass and no tenderness. Left adnexum displays no mass and no tenderness. No bleeding in the vagina. No vaginal discharge found.  Neurological: She is alert and oriented to person, place, and time.  Skin: Skin is warm and dry. No erythema.  Psychiatric: She has a normal mood and affect.    Results for orders placed or performed during the hospital encounter of 09/17/16 (from the past 24 hour(s))  Urinalysis, Routine w reflex microscopic     Status: Abnormal   Collection Time: 09/17/16  1:30 PM  Result Value Ref Range   Color, Urine YELLOW YELLOW    APPearance HAZY (A) CLEAR   Specific Gravity, Urine 1.014 1.005 - 1.030   pH 6.0 5.0 - 8.0   Glucose, UA NEGATIVE NEGATIVE mg/dL   Hgb urine dipstick NEGATIVE NEGATIVE   Bilirubin Urine NEGATIVE NEGATIVE   Ketones, ur 20 (A) NEGATIVE mg/dL   Protein, ur NEGATIVE NEGATIVE mg/dL   Nitrite NEGATIVE NEGATIVE   Leukocytes, UA NEGATIVE NEGATIVE  Pregnancy, urine POC     Status: Abnormal   Collection Time: 09/17/16  1:44 PM  Result Value Ref Range   Preg Test, Ur POSITIVE (A) NEGATIVE  Wet prep, genital     Status: Abnormal   Collection Time: 09/17/16  1:50 PM  Result Value Ref Range   Yeast Wet Prep HPF POC NONE SEEN NONE SEEN   Trich, Wet Prep NONE SEEN NONE SEEN   Clue Cells Wet Prep HPF POC PRESENT (A) NONE SEEN   WBC, Wet Prep HPF POC FEW (A) NONE SEEN   Sperm NONE SEEN   hCG, quantitative, pregnancy     Status: Abnormal   Collection Time: 09/17/16  3:20 PM  Result Value Ref Range   hCG, Beta Chain, Quant, S 5,395 (H) <5 mIU/mL   [place] Ob Comp Less 14 Wks  Result Date: 09/17/2016 CLINICAL DATA:  30 year old pregnant female presents with pelvic pain and cramping. Uncertain LMP. Quantitative beta HCG level is pending. EXAM: OBSTETRIC <14 WK US AND TRANSVAGINAL OB US TECHNIQUE: Both transabdominal and transvaginal ultrasound examinations were performed for complete evaluation of the gestation as well as the maternal uterus, adnexal regions, and pelvic cul-de-sac. Transvaginal technique was performed to assess early pregnancy. COMPARISON:  None. FINDINGS: The anteverted uterus measures 10.2 x 5.9 x 6.3 cm in size. No uterine fibroids or other myometrial abnormalities are demonstrated. There is an indeterminate 8 x 3 x 7 mm sac-like structure in the upper endometrial cavity with no yolk sac, embryo or embryonic cardiac activity demonstrated. There is trace endometrial fluid surrounding the sac-like structure. The ovaries are visualized only on the transabdominal portion of the scan and appear  normal. The right ovary measures 3.9 x 2.5 x 2.5 cm. The left ovary measures 3.1 x 1.8 x 2.3 cm. No adnexal masses. Small volume simple appearing free fluid in the right pelvis. IMPRESSION: 1. Pregnancy is not definitively localized on this scan. Indeterminate 8 mm sac-like structure in the upper endometrial cavity with no yolk sac or embryo detected. This sac-like structure could represent an early intrauterine gestational sac or pseudo-gestational sac. The sonographic differential diagnosis continues to include an intrauterine gestation, an occult ectopic gestation or a spontaneous abortion. Recommend correlation with serum beta HCG level. Close clinical follow-up with serial serum beta HCG monitoring and short-term follow-up obstetric scan are recommended. 2. No adnexal masses.  3. Nonspecific small volume simple appearing free fluid in the right pelvis. Electronically Signed   By: Delbert Phenix M.D.   On: 09/17/2016 15:08   US Ob Transvaginal  Result Date: 09/17/2016 CLINICAL DATA:  30 year old pregnant female presents with pelvic pain and cramping. Uncertain LMP. Quantitative beta HCG level is pending. EXAM: OBSTETRIC <14 WK [place] AND TRANSVAGINAL OB US TECHNIQUE: Both transabdominal and transvaginal ultrasound examinations were performed for complete evaluation of the gestation as well as the maternal uterus, adnexal regions, and pelvic cul-de-sac. Transvaginal technique was performed to assess early pregnancy. COMPARISON:  None. FINDINGS: The anteverted uterus measures 10.2 x 5.9 x 6.3 cm in size. No uterine fibroids or other myometrial abnormalities are demonstrated. There is an indeterminate 8 x 3 x 7 mm sac-like structure in the upper endometrial cavity with no yolk sac, embryo or embryonic cardiac activity demonstrated. There is trace endometrial fluid surrounding the sac-like structure. The ovaries are visualized only on the transabdominal portion of the scan and appear normal. The right ovary measures  3.9 x 2.5 x 2.5 cm. The left ovary measures 3.1 x 1.8 x 2.3 cm. No adnexal masses. Small volume simple appearing free fluid in the right pelvis. IMPRESSION: 1. Pregnancy is not definitively localized on this scan. Indeterminate 8 mm sac-like structure in the upper endometrial cavity with no yolk sac or embryo detected. This sac-like structure could represent an early intrauterine gestational sac or pseudo-gestational sac. The sonographic differential diagnosis continues to include an intrauterine gestation, an occult ectopic gestation or a spontaneous abortion. Recommend correlation with serum beta HCG level. Close clinical follow-up with serial serum beta HCG monitoring and short-term follow-up obstetric scan are recommended. 2. No adnexal masses. 3. Nonspecific small volume simple appearing free fluid in the right pelvis. Electronically Signed   By: Delbert Phenix M.D.   On: 09/17/2016 15:08     MAU Course  Procedures None  MDM +UPT UA, wet prep, GC/chlamydia, CBC, quant hCG, HIV, RPR and [place] today to rule out ectopic pregnancy A+ blood type from previous visit in Epic Discussed patient with Dr. Despina Hidden. Agrees with plan for follow-up.  Assessment and Plan  A: Pregnancy of unknown location Bacterial vaginosis  P: Discharge home Rx for Flagyl given to patient  Discussed use of Probiotics and appropriate hygiene products for avoiding recurrent BV Ectopic precautions discussed Patient advised to follow-up with [facility] on 09/20/16 at 11:00 am for repeat labs Patient may return to MAU as needed or if her condition were to change or worsen   Marny Lowenstein, PA-C  09/17/2016, 4:26 PM

## 2016-09-18 LAB — GC/CHLAMYDIA PROBE AMP (~~LOC~~) NOT AT ARMC
CHLAMYDIA, DNA PROBE: NEGATIVE
NEISSERIA GONORRHEA: NEGATIVE

## 2016-09-20 ENCOUNTER — Telehealth: Payer: Self-pay | Admitting: General Practice

## 2016-09-20 ENCOUNTER — Ambulatory Visit: Payer: Medicaid Other | Admitting: General Practice

## 2016-09-20 DIAGNOSIS — O3680X0 Pregnancy with inconclusive fetal viability, not applicable or unspecified: Secondary | ICD-10-CM

## 2016-09-20 LAB — HCG, QUANTITATIVE, PREGNANCY: HCG, BETA CHAIN, QUANT, S: 12962 m[IU]/mL — AB (ref <5)

## 2016-09-20 NOTE — Telephone Encounter (Signed)
Called patient with results and ultrasound appt. Patient verbalized understanding to all and had no questions

## 2016-09-20 NOTE — Progress Notes (Signed)
Patient here for stat bhcg today. Patient reports continued generalized lower abdominal pain but pain has improved since Sunday. Per Dr Alysia PennaErvin, patient does not need to wait for results, can be contacted by phone. Informed patient and she left contact number [phone]. Spoke with Dr Vergie LivingPickens who finds rise in bhcg levels appropriate, patient needs follow up ultrasound 7-10 days from initial. Scheduled for 12/20 @ 9am. Will call patient with results

## 2016-09-27 ENCOUNTER — Ambulatory Visit (HOSPITAL_COMMUNITY): Admission: RE | Admit: 2016-09-27 | Payer: Medicaid Other | Source: Ambulatory Visit

## 2017-04-17 ENCOUNTER — Encounter: Payer: Self-pay | Admitting: Emergency Medicine

## 2017-04-17 ENCOUNTER — Emergency Department: Payer: Medicaid Other

## 2017-04-17 ENCOUNTER — Emergency Department
Admission: EM | Admit: 2017-04-17 | Discharge: 2017-04-17 | Disposition: A | Discharge status: Home / Self Care | Payer: Medicaid Other | Attending: Emergency Medicine | Admitting: Emergency Medicine

## 2017-04-17 DIAGNOSIS — F419 Anxiety disorder, unspecified: Secondary | ICD-10-CM | POA: Diagnosis not present

## 2017-04-17 DIAGNOSIS — J45909 Unspecified asthma, uncomplicated: Secondary | ICD-10-CM | POA: Insufficient documentation

## 2017-04-17 DIAGNOSIS — R29810 Facial weakness: Secondary | ICD-10-CM | POA: Diagnosis present

## 2017-04-17 DIAGNOSIS — R479 Unspecified speech disturbances: Secondary | ICD-10-CM

## 2017-04-17 DIAGNOSIS — R471 Dysarthria and anarthria: Secondary | ICD-10-CM | POA: Diagnosis not present

## 2017-04-17 DIAGNOSIS — F411 Generalized anxiety disorder: Secondary | ICD-10-CM

## 2017-04-17 LAB — TROPONIN I: Troponin I: <0.03 ng/mL (ref <0.03)

## 2017-04-17 LAB — URINE DRUG SCREEN, QUALITATIVE (ARMC ONLY)
Amphetamines, Ur Screen: NOT DETECTED
BARBITURATES, UR SCREEN: NOT DETECTED
BENZODIAZEPINE, UR SCRN: NOT DETECTED
COCAINE METABOLITE, UR ~~LOC~~: NOT DETECTED
Cannabinoid 50 Ng, Ur ~~LOC~~: NOT DETECTED
MDMA (Ecstasy)Ur Screen: NOT DETECTED
METHADONE SCREEN, URINE: NOT DETECTED
Opiate, Ur Screen: NOT DETECTED
Phencyclidine (PCP) Ur S: NOT DETECTED
TRICYCLIC, UR SCREEN: NOT DETECTED

## 2017-04-17 LAB — PROTIME-INR
INR: 1.04
PROTHROMBIN TIME: 13.6 s (ref 11.4–15.2)

## 2017-04-17 LAB — APTT: aPTT: 28 seconds (ref 24–36)

## 2017-04-17 LAB — COMPREHENSIVE METABOLIC PANEL
ALBUMIN: 4.4 g/dL (ref 3.5–5.0)
ALK PHOS: 55 U/L (ref 38–126)
ALT: 11 U/L — ABNORMAL LOW (ref 14–54)
AST: 16 U/L (ref 15–41)
Anion gap: 8 (ref 5–15)
BILIRUBIN TOTAL: 0.6 mg/dL (ref 0.3–1.2)
BUN: 10 mg/dL (ref 6–20)
CALCIUM: 9.7 mg/dL (ref 8.9–10.3)
CO2: 24 mmol/L (ref 22–32)
Chloride: 106 mmol/L (ref 101–111)
Creatinine, Ser: 0.81 mg/dL (ref 0.44–1.00)
Glucose, Bld: 116 mg/dL — ABNORMAL HIGH (ref 65–99)
Potassium: 3.7 mmol/L (ref 3.5–5.1)
Sodium: 138 mmol/L (ref 135–145)
TOTAL PROTEIN: 8.5 g/dL — AB (ref 6.5–8.1)

## 2017-04-17 LAB — URINALYSIS, ROUTINE W REFLEX MICROSCOPIC
Bilirubin Urine: NEGATIVE
Glucose, UA: NEGATIVE mg/dL
HGB URINE DIPSTICK: NEGATIVE
KETONES UR: 20 mg/dL — AB
Leukocytes, UA: NEGATIVE
Nitrite: NEGATIVE
Protein, ur: NEGATIVE mg/dL
Specific Gravity, Urine: 1.013 (ref 1.005–1.030)
pH: 7 (ref 5.0–8.0)

## 2017-04-17 LAB — CBC WITH DIFFERENTIAL/PLATELET
BASOS ABS: 0.1 10*3/uL (ref 0–0.1)
BASOS PCT: 1 %
EOS PCT: 1 %
Eosinophils Absolute: 0 10*3/uL (ref 0–0.7)
HEMATOCRIT: 38.5 % (ref 35.0–47.0)
Hemoglobin: 13 g/dL (ref 12.0–16.0)
Lymphocytes Relative: 17 %
Lymphs Abs: 1.7 10*3/uL (ref 1.0–3.6)
MCH: 28.5 pg (ref 26.0–34.0)
MCHC: 33.8 g/dL (ref 32.0–36.0)
MCV: 84.3 fL (ref 80.0–100.0)
MONO ABS: 0.4 10*3/uL (ref 0.2–0.9)
Monocytes Relative: 4 %
NEUTROS ABS: 7.7 10*3/uL — AB (ref 1.4–6.5)
Neutrophils Relative %: 77 %
PLATELETS: 437 10*3/uL (ref 150–440)
RBC: 4.56 MIL/uL (ref 3.80–5.20)
RDW: 12.2 % (ref 11.5–14.5)
WBC: 9.9 10*3/uL (ref 3.6–11.0)

## 2017-04-17 LAB — GLUCOSE, CAPILLARY: GLUCOSE-CAPILLARY: 95 mg/dL (ref 65–99)

## 2017-04-17 NOTE — ED Notes (Signed)
Pt voice is more clear at this time

## 2017-04-17 NOTE — ED Notes (Signed)
Neruologist MD Reynolds at bedside

## 2017-04-17 NOTE — ED Provider Notes (Signed)
[facility] Emergency Department Provider Note   ____________________________________________    I have reviewed the triage vital signs and the nursing notes.   HISTORY  Chief Complaint Facial Droop     HPI Joanna Padilla is a 31 y.o. female who presents with reported left-sided facial droop and slurred speech. EMS reports 911 was unable to understand her complaint because of her slurred speech. Patient reports this started abruptly but she does not know the exact time. Patient also admits to being quite stressed but has never had difficulty speaking in the past. She denies headache or head injury. No focal deficits although she complains her entire body feels weak.    Past Medical History:  Diagnosis Date  . Abnormal Pap smear   . Acute bronchitis   . Anxiety   . Asthma   . Chlamydia infection   . Infection    UTI  . Obesity, unspecified   . Seasonal allergies   . Sickle cell trait (HCC)   . Unspecified asthma(493.90)   . Urinary tract infection     Patient Active Problem List   Diagnosis Date Noted  . Severe dysplasia of cervix (CIN III) 07/14/2015  . Pap smear abnormality of cervix with LGSIL 05/27/2013  . Sickle cell trait (HCC) 03/09/2013  . Allergy to penicillin 03/09/2013  . OBESITY 12/26/2010  . ADJUSTMENT DISORDER 12/26/2010  . ASTHMA 12/26/2010    Past Surgical History:  Procedure Laterality Date  . ABDOMINAL SURGERY     c-section  . CESAREAN SECTION    . CESAREAN SECTION    . LEEP  07/2015  . WISDOM TOOTH EXTRACTION      Prior to Admission medications   Medication Sig Start Date End Date Taking? Authorizing Provider  albuterol (PROVENTIL HFA;VENTOLIN HFA) 108 (90 BASE) MCG/ACT inhaler Inhale 1-2 puffs into the lungs every 6 (six) hours as needed for wheezing or shortness of breath.    [provider]  cetirizine (ZYRTEC) 10 MG tablet Take 10 mg by mouth daily.    [provider]  metroNIDAZOLE  (FLAGYL) 500 MG tablet Take 1 tablet (500 mg total) by mouth 2 (two) times daily. Patient not taking: Reported on 04/17/2017 09/17/16   Marny Lowenstein, PA-C     Allergies Penicillins and Penicillins  Family History  Problem Relation Age of Onset  . Hyperlipidemia Mother   . Transient ischemic attack Mother   . Mental illness Mother   . Diabetes Mother   . Hypertension Mother   . Congestive Heart Failure Mother   . Diabetes Maternal Grandmother   . Hypertension Maternal Grandmother     Social History Social History  Substance Use Topics  . Smoking status: Never Smoker  . Smokeless tobacco: Never Used  . Alcohol use No    Review of Systems  Constitutional: No fever/chills Eyes: No visual changes.  ENT: No Neck pain Cardiovascular: Denies chest pain. Respiratory: Denies shortness of breath. Gastrointestinal: No abdominal pain.  No nausea, no vomiting.   Genitourinary: Negative for dysuria. Musculoskeletal: Negative for back pain. Skin: Negative for rash. Neurological: Negative for headaches    ____________________________________________   PHYSICAL EXAM:  VITAL SIGNS: ED Triage Vitals  Enc Vitals Group     BP 04/17/17 1128 (!) 138/92     Pulse Rate 04/17/17 1128 70     Resp 04/17/17 1128 16     Temp --      Temp src --      SpO2  04/17/17 1128 100 %     Weight 04/17/17 1135 81.6 kg (180 lb)     Height 04/17/17 1135 1.6 m (5\' 3" )     Head Circumference --      Peak Flow --      Pain Score --      Pain Loc --      Pain Edu? --      Excl. in GC? --     Constitutional: Alert and oriented. No acute distress. Anxious appearing Eyes: Conjunctivae are normal. EOMI Head: Atraumatic.  Mouth/Throat: Mucous membranes are moist.   Neck:  Painless ROM Cardiovascular: Normal rate, regular rhythm. Grossly normal heart sounds.  Good peripheral circulation. Respiratory: Normal respiratory effort.  No retractions. Lungs CTAB. Gastrointestinal: Soft and nontender. No  distention.  No CVA tenderness. Genitourinary: deferred Musculoskeletal: No lower extremity tenderness nor edema.  Warm and well perfused Neurologic:  Normal speech and language. Questionable left facial droop, slurred speech, difficult to understand. Strength appears equal in all extremities Skin:  Skin is warm, dry and intact. No rash noted. Psychiatric: Mood and affect are normal. Speech and behavior are normal.  ____________________________________________   LABS (all labs ordered are listed, but only abnormal results are displayed)  Labs Reviewed  COMPREHENSIVE METABOLIC PANEL - Abnormal; Notable for the following:       Result Value   Glucose, Bld 116 (*)    Total Protein 8.5 (*)    ALT 11 (*)    All other components within normal limits  CBC WITH DIFFERENTIAL/PLATELET - Abnormal; Notable for the following:    Neutro Abs 7.7 (*)    All other components within normal limits  URINALYSIS, ROUTINE W REFLEX MICROSCOPIC - Abnormal; Notable for the following:    Color, Urine YELLOW (*)    APPearance CLEAR (*)    Ketones, ur 20 (*)    All other components within normal limits  APTT  TROPONIN I  PROTIME-INR  GLUCOSE, CAPILLARY  URINE DRUG SCREEN, QUALITATIVE ([facility] ONLY)   ____________________________________________  EKG  ED ECG REPORT I, Jene Every, the attending physician, personally viewed and interpreted this ECG.  Date: 04/17/2017 EKG Time: 11:33 AM Rate: 73 Rhythm: normal sinus rhythm QRS Axis: normal Intervals: normal ST/T Wave abnormalities: normal Narrative Interpretation: unremarkable  ____________________________________________  RADIOLOGY  CT head unremarkable MRI brain unremarkable ____________________________________________   PROCEDURES  Procedure(s) performed: No    Critical Care performed: No ____________________________________________   INITIAL IMPRESSION / ASSESSMENT AND PLAN / ED COURSE  Pertinent labs & imaging results that  were available during my care of the patient were reviewed by me and considered in my medical decision making (see chart for details).  Patient presents with questionable left-sided facial droop but significant slurred speech. She has a history of sickle cell trait although not disease. Given her reports of abrupt onset of slurred speech had called a code stroke however I suspect this is more anxiety related  Patient seen by Dr. Thad Ranger of neurology, recommends MRI, no indication for TPA at this time as symptoms have mostly improved  MRI unremarkable, patient is asymptomatic. Suspect this was anxiety/stress related. Okay for discharge at this time    ____________________________________________   FINAL CLINICAL IMPRESSION(S) / ED DIAGNOSES  Final diagnoses:  Anxiety reaction      NEW MEDICATIONS STARTED DURING THIS VISIT:  Discharge Medication List as of 04/17/2017  2:52 PM       Note:  This document was prepared using Dragon voice recognition software  and may include unintentional dictation errors.    Jene EveryKinner, Altair Appenzeller, MD 04/17/17 [phone]

## 2017-04-17 NOTE — Consult Note (Signed)
Referring Physician: Cyril Loosen    Chief Complaint: Difficulty with speech, leg weakness  HPI: Joanna Padilla is an 31 y.o. female with a history of sickle cell trait who has been under a lot of stress recently due to deaths of people close to her.  She was at home crying it out today when she noted when attempting to talk to someone that she was unable to get her words out.  She knew what she wanted to say but was unable to say it.  She called 911.  Felt leg weakness as well and was unable to walk to the door and had to crawl to the door.  EMS brought the patient in for evaluation.  Initial NIHSS of 4.    Date last known well: Date: 04/17/2017 Time last known well: Time: 10:15 tPA Given: No: Improvement in symptoms, not felt to be a stroke  Past Medical History:  Diagnosis Date  . Abnormal Pap smear   . Acute bronchitis   . Anxiety   . Asthma   . Chlamydia infection   . Infection    UTI  . Obesity, unspecified   . Seasonal allergies   . Sickle cell trait (HCC)   . Unspecified asthma(493.90)   . Urinary tract infection     Past Surgical History:  Procedure Laterality Date  . ABDOMINAL SURGERY     c-section  . CESAREAN SECTION    . CESAREAN SECTION    . LEEP  07/2015  . WISDOM TOOTH EXTRACTION      Family History  Problem Relation Age of Onset  . Hyperlipidemia Mother   . Transient ischemic attack Mother   . Mental illness Mother   . Diabetes Mother   . Hypertension Mother   . Congestive Heart Failure Mother   . Diabetes Maternal Grandmother   . Hypertension Maternal Grandmother    Social History:  reports that she has recently started smoking. She reports that she does not drink alcohol or use drugs.  Allergies:  Allergies  Allergen Reactions  . Penicillins Anaphylaxis and Hives    Has patient had a PCN reaction causing immediate rash, facial/tongue/throat swelling, SOB or lightheadedness with hypotension: Yes Has patient had a PCN reaction causing severe rash  involving mucus membranes or skin necrosis: No Has patient had a PCN reaction that required hospitalization No Has patient had a PCN reaction occurring within the last 10 years: No If all of the above answers are "NO", then may proceed with Cephalosporin use.   Marland Kitchen Penicillins Hives    Medications: I have reviewed the patient's current medications. Prior to Admission:  Prior to Admission medications   Medication Sig Start Date End Date Taking? Authorizing Provider  albuterol (PROVENTIL HFA;VENTOLIN HFA) 108 (90 BASE) MCG/ACT inhaler Inhale 1-2 puffs into the lungs every 6 (six) hours as needed for wheezing or shortness of breath.    [provider]  cetirizine (ZYRTEC) 10 MG tablet Take 10 mg by mouth daily.    [provider]  metroNIDAZOLE (FLAGYL) 500 MG tablet Take 1 tablet (500 mg total) by mouth 2 (two) times daily. 09/17/16   Marny Lowenstein, PA-C     ROS: History obtained from the patient  General ROS: negative for - chills, fatigue, fever, night sweats, weight gain or weight loss Psychological ROS: depression Ophthalmic ROS: negative for - blurry vision, double vision, eye pain or loss of vision ENT ROS: negative for - epistaxis, nasal discharge, oral lesions, sore throat, tinnitus or  vertigo Allergy and Immunology ROS: negative for - hives or itchy/watery eyes Hematological and Lymphatic ROS: negative for - bleeding problems, bruising or swollen lymph nodes Endocrine ROS: negative for - galactorrhea, hair pattern changes, polydipsia/polyuria or temperature intolerance Respiratory ROS: negative for - cough, hemoptysis, shortness of breath or wheezing Cardiovascular ROS: negative for - chest pain, dyspnea on exertion, edema or irregular heartbeat Gastrointestinal ROS: negative for - abdominal pain, diarrhea, hematemesis, nausea/vomiting or stool incontinence Genito-Urinary ROS: negative for - dysuria, hematuria, incontinence or urinary  frequency/urgency Musculoskeletal ROS: negative for - joint swelling or muscular weakness Neurological ROS: as noted in HPI Dermatological ROS: negative for rash and skin lesion changes  Physical Examination: Blood pressure (!) 138/92, pulse 66, temperature 98.2 F (36.8 C), temperature source Oral, resp. rate 17, height 5\' 3"  (1.6 m), weight 81.6 kg (180 lb), last menstrual period 08/06/2016, SpO2 100 %, unknown if currently breastfeeding.  HEENT-  Normocephalic, no lesions, without obvious abnormality.  Normal external eye and conjunctiva.  Normal TM's bilaterally.  Normal auditory canals and external ears. Normal external nose, mucus membranes and septum.  Normal pharynx. Cardiovascular- S1, S2 normal, pulses palpable throughout   Lungs- chest clear, no wheezing, rales, normal symmetric air entry Abdomen- soft, non-tender; bowel sounds normal; no masses,  no organomegaly Extremities- no edema Lymph-no adenopathy palpable Musculoskeletal-no joint tenderness, deformity or swelling Skin-warm and dry, no hyperpigmentation, vitiligo, or suspicious lesions  Neurological Examination   Mental Status: Alert, oriented, thought content appropriate.  Speech fluent with patient stuttering often.  Able to follow 3 step commands without difficulty. Cranial Nerves: II: Discs flat bilaterally; Visual fields grossly normal, pupils equal, round, reactive to light and accommodation III,IV, VI: ptosis not present, extra-ocular motions intact bilaterally V,VII: smile symmetric, facial light touch sensation mildly decreased on the left VIII: hearing normal bilaterally IX,X: gag reflex present XI: bilateral shoulder shrug XII: midline tongue extension Motor: Right : Upper extremity   5/5    Left:     Upper extremity   5/5 Although able to lift both legs off the bed unable to maintain.  Does not have downward movement of the leg not trying to lift on testing of either lower extremity.   Sensory: Pinprick  and light touch intact throughout, bilaterally Deep Tendon Reflexes: 2+ and symmetric throughout Plantars: Right: mute   Left: mute Cerebellar: Normal finger-to-nose and normal heel-to-shin testing bilaterally Gait: not tested due to safety concerns    Laboratory Studies:  Basic Metabolic Panel: No results for input(s): NA, K, CL, CO2, GLUCOSE, BUN, CREATININE, CALCIUM, MG, PHOS in the last 168 hours.  Liver Function Tests: No results for input(s): AST, ALT, ALKPHOS, BILITOT, PROT, ALBUMIN in the last 168 hours. No results for input(s): LIPASE, AMYLASE in the last 168 hours. No results for input(s): AMMONIA in the last 168 hours.  CBC: No results for input(s): WBC, NEUTROABS, HGB, HCT, MCV, PLT in the last 168 hours.  Cardiac Enzymes: No results for input(s): CKTOTAL, CKMB, CKMBINDEX, TROPONINI in the last 168 hours.  BNP: Invalid input(s): POCBNP  CBG:  Recent Labs Lab 04/17/17 1132  GLUCAP 95    Microbiology: Results for orders placed or performed during the hospital encounter of 09/17/16  Wet prep, genital     Status: Abnormal   Collection Time: 09/17/16  1:50 PM  Result Value Ref Range Status   Yeast Wet Prep HPF POC NONE SEEN NONE SEEN Final   Trich, Wet Prep NONE SEEN NONE SEEN Final   Clue  Cells Wet Prep HPF POC PRESENT (A) NONE SEEN Final   WBC, Wet Prep HPF POC FEW (A) NONE SEEN Final    Comment: MANY BACTERIA SEEN   Sperm NONE SEEN  Final    Coagulation Studies: No results for input(s): LABPROT, INR in the last 72 hours.  Urinalysis: No results for input(s): COLORURINE, LABSPEC, PHURINE, GLUCOSEU, HGBUR, BILIRUBINUR, KETONESUR, PROTEINUR, UROBILINOGEN, NITRITE, LEUKOCYTESUR in the last 168 hours.  Invalid input(s): APPERANCEUR  Lipid Panel: No results found for: CHOL, TRIG, HDL, CHOLHDL, VLDL, LDLCALC  HgbA1C: No results found for: HGBA1C  Urine Drug Screen:     Component Value Date/Time   LABOPIA NONE DETECTED 10/28/2012 1515   COCAINSCRNUR  NONE DETECTED 10/28/2012 1515   LABBENZ NONE DETECTED 10/28/2012 1515   AMPHETMU NONE DETECTED 10/28/2012 1515   THCU NONE DETECTED 10/28/2012 1515   LABBARB NONE DETECTED 10/28/2012 1515    Alcohol Level: No results for input(s): ETH in the last 168 hours.  Other results: EKG: sinus arrhythmia at 73 bpm.  Imaging: Ct Head Wo Contrast  Addendum Date: 04/17/2017   ADDENDUM REPORT: 04/17/2017 12:05 ADDENDUM: These results were called by telephone at the time of interpretation on 04/17/2017 at 12:03 pm to Dr. Thad Rangereynolds , who verbally acknowledged these results. Electronically Signed   By: Marnee SpringJonathon  Watts M.D.   On: 04/17/2017 12:05   Result Date: 04/17/2017 CLINICAL DATA:  Facial droop, slurred speech. EXAM: CT HEAD WITHOUT CONTRAST TECHNIQUE: Contiguous axial images were obtained from the base of the skull through the vertex without intravenous contrast. COMPARISON:  None. FINDINGS: Brain: No evidence of acute infarction, hemorrhage, hydrocephalus, extra-axial collection or mass lesion/mass effect. Vascular: No hyperdense vessel or unexpected calcification. Skull: Negative Sinuses/Orbits: Negative IMPRESSION: Negative head CT. Electronically Signed: By: Marnee SpringJonathon  Watts M.D. On: 04/17/2017 11:59    Assessment: 31 y.o. female presenting with difficulty with speech and lower extremity weakness.  Symptoms are improving.  Patient under a lot of stress and anxious on evaluation.  Head CT reviewed and shows no acute changes.  Will rule out for possible stroke.    Stroke Risk Factors - smoking  Plan: 1. HgbA1c, fasting lipid panel 2. MRI of the brain without contrast.  If MRI of the brain unremarkable, no further neurological work up recommended at this time.   3. NPO until RN stroke swallow screen 4. Telemetry monitoring 5. Frequent neuro checks 6. ASA 325mg  now  Case discussed with Dr. Geronimo BootKinner  Aliha Diedrich, MD Neurology [phone] 04/17/2017, 12:19 PM

## 2017-04-17 NOTE — ED Notes (Signed)
Mother at bedside.

## 2017-04-17 NOTE — ED Notes (Signed)
Pt ambulatory to bathroom

## 2017-04-17 NOTE — ED Notes (Signed)
Last known well aprox 10am today

## 2017-04-17 NOTE — ED Triage Notes (Signed)
Pt to ED via EMS from home , c/o slurred speech and facial droop. Pt hard to understanding, facial droop noted. VS stable. Pt unaware of last time normal. Pt A&Ox4. Pt states increased stress, "everyone keeps dying". MD at bedside

## 2017-04-17 NOTE — ED Notes (Signed)
Patient transported to MRI 

## 2017-04-17 NOTE — Progress Notes (Signed)
CH received a PG for a Code Stroke. CH reported to ED room 4 and meet with mother of PT. CH helped mother register PT and got her water. CH prayed with family. Will follow up    04/17/17 1150  Clinical Encounter Type  Visited With Patient and family together  Visit Type Initial  Referral From Nurse  Consult/Referral To Chaplain  Spiritual Encounters  Spiritual Needs Prayer  as needed.

## 2017-09-28 ENCOUNTER — Ambulatory Visit: Payer: Self-pay | Admitting: Family Medicine

## 2018-01-11 ENCOUNTER — Encounter (HOSPITAL_COMMUNITY): Payer: Self-pay | Admitting: *Deleted

## 2018-01-11 ENCOUNTER — Other Ambulatory Visit: Payer: Self-pay

## 2018-01-11 ENCOUNTER — Inpatient Hospital Stay (HOSPITAL_COMMUNITY)
Admission: AD | Admit: 2018-01-11 | Discharge: 2018-01-11 | Disposition: A | Discharge status: Home / Self Care | Payer: Medicaid Other | Source: Ambulatory Visit | Attending: Obstetrics and Gynecology | Admitting: Obstetrics and Gynecology

## 2018-01-11 DIAGNOSIS — Z3202 Encounter for pregnancy test, result negative: Secondary | ICD-10-CM | POA: Insufficient documentation

## 2018-01-11 DIAGNOSIS — N390 Urinary tract infection, site not specified: Secondary | ICD-10-CM

## 2018-01-11 DIAGNOSIS — R300 Dysuria: Secondary | ICD-10-CM | POA: Diagnosis present

## 2018-01-11 DIAGNOSIS — Z880 Allergy status to penicillin: Secondary | ICD-10-CM | POA: Insufficient documentation

## 2018-01-11 LAB — URINALYSIS, ROUTINE W REFLEX MICROSCOPIC
Bilirubin Urine: NEGATIVE
GLUCOSE, UA: NEGATIVE mg/dL
Ketones, ur: NEGATIVE mg/dL
Nitrite: NEGATIVE
PH: 8 (ref 5.0–8.0)
PROTEIN: 100 mg/dL — AB
SPECIFIC GRAVITY, URINE: 1.013 (ref 1.005–1.030)

## 2018-01-11 LAB — POCT PREGNANCY, URINE: PREG TEST UR: NEGATIVE

## 2018-01-11 MED ORDER — SULFAMETHOXAZOLE-TRIMETHOPRIM 800-160 MG PO TABS: 1.0000 | ORAL_TABLET | Freq: Two times a day (BID) | ORAL | 0 refills | Status: AC

## 2018-01-11 MED ORDER — PHENAZOPYRIDINE HCL 200 MG PO TABS: 200.0000 mg | ORAL_TABLET | Freq: Three times a day (TID) | ORAL | 0 refills | Status: DC

## 2018-01-11 NOTE — MAU Note (Signed)
Been having a hard time with urination, has a sharp pain at the end of urination.  Been going on for 3 days.  Trying to drink more water and cranberry juice, but it isn't helping.  Seeing some blood in her urine.   Frequency and urgency.  Has been having runny diarrhea since this started, once or twice a day.  Neg CVA tenderness.

## 2018-01-11 NOTE — MAU Provider Note (Signed)
History     CSN: [id number]  Arrival date and time: 01/11/18 1038   First Provider Initiated Contact with Patient 01/11/18 1215      Chief Complaint  Patient presents with  . Dysuria   HPI   [name] is a 32 y.o. female G3P2002 here in MAU with UTI symptoms. Dysuria started 3 days ago. + pressure. No back pain or fever noted.  Non pregnant female. Has not tried any over the counter medications.   OB History    Gravida  3   Para  2   Term  2   Preterm      AB      Living  2     SAB      TAB      Ectopic      Multiple      Live Births  2           Past Medical History:  Diagnosis Date  . Abnormal Pap smear   . Acute bronchitis   . Anxiety   . Asthma   . Chlamydia infection   . Infection    UTI  . Obesity, unspecified   . Seasonal allergies   . Sickle cell trait (HCC)   . Unspecified asthma(493.90)   . Urinary tract infection     Past Surgical History:  Procedure Laterality Date  . ABDOMINAL SURGERY     c-section  . CESAREAN SECTION    . CESAREAN SECTION    . LEEP  07/2015  . WISDOM TOOTH EXTRACTION      Family History  Problem Relation Age of Onset  . Hyperlipidemia Mother   . Transient ischemic attack Mother   . Mental illness Mother   . Diabetes Mother   . Hypertension Mother   . Congestive Heart Failure Mother   . Diabetes Maternal Grandmother   . Hypertension Maternal Grandmother     Social History   Tobacco Use  . Smoking status: Never Smoker  . Smokeless tobacco: Never Used  Substance Use Topics  . Alcohol use: No  . Drug use: No    Allergies:  Allergies  Allergen Reactions  . Penicillins Anaphylaxis and Hives    Has patient had a PCN reaction causing immediate rash, facial/tongue/throat swelling, SOB or lightheadedness with hypotension: Yes Has patient had a PCN reaction causing severe rash involving mucus membranes or skin necrosis: No Has patient had a PCN reaction that required hospitalization No Has  patient had a PCN reaction occurring within the last 10 years: No If all of the above answers are "NO", then may proceed with Cephalosporin use.   Marland Kitchen Penicillins Hives    Medications Prior to Admission  Medication Sig Dispense Refill Last Dose  . albuterol (PROVENTIL HFA;VENTOLIN HFA) 108 (90 BASE) MCG/ACT inhaler Inhale 1-2 puffs into the lungs every 6 (six) hours as needed for wheezing or shortness of breath.   prn at prn  . cetirizine (ZYRTEC) 10 MG tablet Take 10 mg by mouth daily.   prn at prn  . metroNIDAZOLE (FLAGYL) 500 MG tablet Take 1 tablet (500 mg total) by mouth 2 (two) times daily. (Patient not taking: Reported on 04/17/2017) 14 tablet 0 Not Taking at Unknown time   Results for orders placed or performed during the hospital encounter of 01/11/18 (from the past 48 hour(s))  Urinalysis, Routine w reflex microscopic     Status: Abnormal   Collection Time: 01/11/18 10:45 AM  Result Value Ref Range  Color, Urine YELLOW YELLOW   APPearance CLOUDY (A) CLEAR   Specific Gravity, Urine 1.013 1.005 - 1.030   pH 8.0 5.0 - 8.0   Glucose, UA NEGATIVE NEGATIVE mg/dL   Hgb urine dipstick SMALL (A) NEGATIVE   Bilirubin Urine NEGATIVE NEGATIVE   Ketones, ur NEGATIVE NEGATIVE mg/dL   Protein, ur 161100 (A) NEGATIVE mg/dL   Nitrite NEGATIVE NEGATIVE   Leukocytes, UA LARGE (A) NEGATIVE   RBC / HPF TOO NUMEROUS TO COUNT 0 - 5 RBC/hpf   WBC, UA TOO NUMEROUS TO COUNT 0 - 5 WBC/hpf   Bacteria, UA RARE (A) NONE SEEN   Squamous Epithelial / LPF 0-5 (A) NONE SEEN    Comment: Performed at [facility], [address] [place], [place] [id number]  Pregnancy, urine POC     Status: None   Collection Time: 01/11/18 10:52 AM  Result Value Ref Range   Preg Test, Ur NEGATIVE NEGATIVE    Comment:        THE SENSITIVITY OF THIS METHODOLOGY IS >24 mIU/mL    Review of Systems  Gastrointestinal: Positive for abdominal pain.  Genitourinary: Positive for dysuria, frequency and urgency. Negative for  flank pain.  Musculoskeletal: Negative for back pain.   Physical Exam   Blood pressure 110/65, pulse 60, temperature 99.2 F (37.3 C), temperature source Oral, resp. rate 18, weight 203 lb 12 oz (92.4 kg), SpO2 100 %, unknown if currently breastfeeding.  Physical Exam  Constitutional: She is oriented to person, place, and time. She appears well-developed and well-nourished. No distress.  HENT:  Head: Normocephalic.  Eyes: Pupils are equal, round, and reactive to light.  GI: Soft. Normal appearance. She exhibits no distension. There is no tenderness. There is no rebound, no guarding and no CVA tenderness.  Musculoskeletal: Normal range of motion.  Neurological: She is alert and oriented to person, place, and time.  Skin: Skin is warm. She is not diaphoretic.  Psychiatric: Her behavior is normal.   MAU Course  Procedures  None  MDM  UA  Assessment and Plan   A:  1. Acute lower UTI     P:  Discharge home in stable condition Rx: Bactrim, Pyridium Follow up with GYN   Duane Lopeasch, Lin Glazier I, NP 01/11/2018 2:06 PM

## 2018-01-11 NOTE — Discharge Instructions (Signed)
In late 2019, the [facility] will be moving to the [place] [facility]. At that time, the MAU (Maternity Admissions Unit), where you are being seen today, will no longer take care of non-pregnant patients. We strongly encourage you to find a doctor's office before that time, so that you can be seen with any GYN concerns, like vaginal discharge, urinary tract infection, etc.. in a timely manner. ° °In order to make an office visit more convenient, the [facility] will be offering evening hours with same-day appointments, walk-in appointments and scheduled appointments available during this time. ° °[facility] Hours: °Monday - 8am - 7:30 pm with walk-in between 4pm- 7:30 pm °Tuesday - 8 am - 5 pm (starting 01/08/18 we will be open late and accepting walk-ins from 4pm - 7:30pm) °Wednesday - 8 am - 5 pm (starting 04/10/18 we will be open late and accepting walk-ins from 4pm - 7:30pm) °Thursday 8 am - 5 pm (starting 07/11/18 we will be open late and accepting walk-ins from 4pm - 7:30pm) °Friday 8 am - 5 pm ° °For an appointment please call the [facility] at [phone] ° °For urgent needs, [facility] is also available for management of urgent GYN complaints such as vaginal discharge or urinary tract infections. ° ° ° ° ° °

## 2018-02-12 ENCOUNTER — Ambulatory Visit: Payer: Medicaid Other | Admitting: Physician Assistant

## 2018-02-22 ENCOUNTER — Ambulatory Visit (INDEPENDENT_AMBULATORY_CARE_PROVIDER_SITE_OTHER): Payer: Medicaid Other | Admitting: Physician Assistant

## 2018-02-22 ENCOUNTER — Encounter: Payer: Self-pay | Admitting: Physician Assistant

## 2018-02-22 VITALS — BP 106/62 | HR 72 | Temp 99.1°F | Resp 16 | Wt 204.0 lb

## 2018-02-22 DIAGNOSIS — F419 Anxiety disorder, unspecified: Secondary | ICD-10-CM | POA: Diagnosis not present

## 2018-02-22 DIAGNOSIS — D069 Carcinoma in situ of cervix, unspecified: Secondary | ICD-10-CM

## 2018-02-22 DIAGNOSIS — R87613 High grade squamous intraepithelial lesion on cytologic smear of cervix (HGSIL): Secondary | ICD-10-CM

## 2018-02-22 DIAGNOSIS — D573 Sickle-cell trait: Secondary | ICD-10-CM

## 2018-02-22 DIAGNOSIS — F32A Depression, unspecified: Secondary | ICD-10-CM

## 2018-02-22 DIAGNOSIS — F329 Major depressive disorder, single episode, unspecified: Secondary | ICD-10-CM

## 2018-02-22 MED ORDER — ESCITALOPRAM OXALATE 10 MG PO TABS: 10.0000 mg | ORAL_TABLET | Freq: Every day | ORAL | 0 refills | Status: DC
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
## 2018-02-22 NOTE — Patient Instructions (Signed)

## 2018-03-15 ENCOUNTER — Telehealth: Payer: Self-pay | Admitting: Physician Assistant

## 2018-03-15 NOTE — Telephone Encounter (Signed)
Received [employer] Disability form. Placed form in providers box. Thanks CC

## 2018-03-15 NOTE — Telephone Encounter (Signed)
I have received disability paperwork from this patient. I saw her once as a new patient in May and started her on 10 mg of Lexapro because she had been on no treatment. The disability paperwork is regarding a mental health issue and requires extensive evaluation. I do not fill out disability for mental health issues. If her mental health issues are bad enough to require disability, she will need to see a psychiatrist who can then fill them out. I will place the referral for her if she desires.

## 2018-03-18 NOTE — Telephone Encounter (Signed)
LMTCB 03/18/2018  Thanks,   -Vernona RiegerLaura

## 2018-03-19 ENCOUNTER — Telehealth: Payer: Self-pay | Admitting: Physician Assistant

## 2018-03-19 NOTE — Telephone Encounter (Signed)
Pt advised.  She states her phycologist has already filled it out.  She would like for us to shred this copy.

## 2018-03-19 NOTE — Telephone Encounter (Signed)
Pt called saying she was returning Laura's call  teri

## 2018-03-22 ENCOUNTER — Telehealth: Payer: Self-pay | Admitting: Physician Assistant

## 2018-03-22 NOTE — Telephone Encounter (Signed)
Received [employer] Disability form. Place form in providers box. Thanks CC

## 2018-03-27 ENCOUNTER — Ambulatory Visit (INDEPENDENT_AMBULATORY_CARE_PROVIDER_SITE_OTHER): Payer: Medicaid Other | Admitting: Physician Assistant

## 2018-03-27 ENCOUNTER — Encounter: Payer: Self-pay | Admitting: Physician Assistant

## 2018-03-27 VITALS — BP 114/70 | HR 80 | Temp 99.1°F | Resp 16 | Wt 201.0 lb

## 2018-03-27 DIAGNOSIS — F419 Anxiety disorder, unspecified: Secondary | ICD-10-CM | POA: Diagnosis not present

## 2018-03-27 DIAGNOSIS — D069 Carcinoma in situ of cervix, unspecified: Secondary | ICD-10-CM

## 2018-03-27 DIAGNOSIS — F329 Major depressive disorder, single episode, unspecified: Secondary | ICD-10-CM

## 2018-03-27 DIAGNOSIS — F32A Depression, unspecified: Secondary | ICD-10-CM

## 2018-03-27 MED ORDER — BUSPIRONE HCL 7.5 MG PO TABS: 7.5000 mg | ORAL_TABLET | Freq: Two times a day (BID) | ORAL | 0 refills | Status: AC

## 2018-03-27 NOTE — Patient Instructions (Signed)

## 2018-03-27 NOTE — Progress Notes (Signed)
Patient: Joanna Padilla Female    DOB: 07/01/1986   32 y.o.   MRN: [id number] Visit Date: 03/27/2018  Today's Provider: Trey SailorsAdriana M Pollak, PA-C   Chief Complaint  Patient presents with  . Depression  . Anxiety   Subjective:    Joanna Padilla is a 32 y/o woman presenting today for follow up of anxiety and depression. Last visit she was started on lexapro 10 mg. She reports she took this for three or four days and it made her feel "spaced out." She stopped taking it.   She has many and varied social stressors. States poor relationship with her mom, who intermittently tries to gain custody of her children. Reports mother has called DSS on her in regards to children, has her committed to behavioral health unit when she was pregnant. Reports her mother caused her to suicidal. Cites poor relationship with the father of her child, who is deceased, and his remaining family. She reports lack of social support, does not have any friends. Reports abortion with third pregnancy as additional stressor.   Stays home, no motivation, doesn't feel like doing much. Denies HI/SI.  Previously seen at [facility]. Says she was trying to avoid a psychiatrist and be managed by PCP and therapy.  She has a history of LEEP for severe dysplasia/CIN III in 2016 but does not have follow up PAP documented.   Depression         This is a chronic problem.  The problem occurs constantly.The problem is unchanged (Pt reports 0% improvement.).  Associated symptoms include fatigue, hopelessness, insomnia, restlessness, decreased interest and sad.  Associated symptoms include no decreased concentration, no helplessness, not irritable, no appetite change, no body aches, no myalgias, no headaches, no indigestion and no suicidal ideas.  Past treatments include SSRIs - Selective serotonin reuptake inhibitors.  Compliance with treatment is good.  Past compliance problems include medication issues.  Previous treatment provided no relief  relief.  Past medical history includes anxiety.   Anxiety  Presents for follow-up visit. Symptoms include depressed mood, excessive worry, insomnia, nervous/anxious behavior, panic and restlessness. Patient reports no confusion, decreased concentration, dizziness, nausea, palpitations or suicidal ideas. The quality of sleep is poor. Nighttime awakenings: several.         Allergies  Allergen Reactions  . Penicillins Anaphylaxis and Hives    Has patient had a PCN reaction causing immediate rash, facial/tongue/throat swelling, SOB or lightheadedness with hypotension: Yes Has patient had a PCN reaction causing severe rash involving mucus membranes or skin necrosis: No Has patient had a PCN reaction that required hospitalization No Has patient had a PCN reaction occurring within the last 10 years: No If all of the above answers are "NO", then may proceed with Cephalosporin use.   Marland Kitchen. Penicillins Hives     Current Outpatient Medications:  .  albuterol (PROVENTIL HFA;VENTOLIN HFA) 108 (90 BASE) MCG/ACT inhaler, Inhale 1-2 puffs into the lungs every 6 (six) hours as needed for wheezing or shortness of breath., Disp: , Rfl:  .  cetirizine (ZYRTEC) 10 MG tablet, Take 10 mg by mouth daily., Disp: , Rfl:  .  metroNIDAZOLE (FLAGYL) 500 MG tablet, Take 1 tablet (500 mg total) by mouth 2 (two) times daily., Disp: 14 tablet, Rfl: 0 .  phenazopyridine (PYRIDIUM) 200 MG tablet, Take 1 tablet (200 mg total) by mouth 3 (three) times daily., Disp: 6 tablet, Rfl: 0 .  escitalopram (LEXAPRO) 10 MG tablet, Take 1 tablet (10  mg total) by mouth daily. (Patient not taking: Reported on 03/27/2018), Disp: 90 tablet, Rfl: 0  Review of Systems  Constitutional: Positive for fatigue. Negative for activity change, appetite change, chills, diaphoresis, fever and unexpected weight change.  Respiratory: Negative.   Cardiovascular: Negative.  Negative for palpitations.  Gastrointestinal: Negative for abdominal distention,  abdominal pain, anal bleeding, blood in stool, constipation, diarrhea, nausea, rectal pain and vomiting.  Musculoskeletal: Negative for myalgias.  Neurological: Negative for dizziness, light-headedness and headaches.  Psychiatric/Behavioral: Positive for agitation, depression, dysphoric mood and sleep disturbance. Negative for behavioral problems, confusion, decreased concentration, hallucinations, self-injury and suicidal ideas. The patient is nervous/anxious and has insomnia. The patient is not hyperactive.     Social History   Tobacco Use  . Smoking status: Never Smoker  . Smokeless tobacco: Never Used  Substance Use Topics  . Alcohol use: No   Objective:   BP 114/70 (BP Location: Right Arm, Patient Position: Sitting, Cuff Size: Large)   Pulse 80   Temp 99.1 F (37.3 C) (Oral)   Resp 16   Wt 201 lb (91.2 kg)   BMI 35.61 kg/m  Vitals:   03/27/18 0836  BP: 114/70  Pulse: 80  Resp: 16  Temp: 99.1 F (37.3 C)  TempSrc: Oral  Weight: 201 lb (91.2 kg)     Physical Exam  Constitutional: She is oriented to person, place, and time. She appears well-developed and well-nourished. She is not irritable.  Cardiovascular: Normal rate.  Pulmonary/Chest: Effort normal.  Neurological: She is alert and oriented to person, place, and time.  Skin: Skin is warm and dry.  Psychiatric: Her speech is normal. Her mood appears anxious. She exhibits a depressed mood.  Depressed and very tearful in office when describing her hardships.     Office Visit from 03/27/2018 in [facility]  PHQ-9 Total Score  17          Assessment & Plan:     1. Anxiety and depression  Will try Buspar until she can get into psychiatry. Needs to be managed by psychiatry, her mood disorder and psychosocial stressors are beyond the realm of primary care. She should continue with therapy. May need to be seen in [place] or by community provider due to [facility].  - busPIRone (BUSPAR) 7.5 MG tablet;  Take 1 tablet (7.5 mg total) by mouth 2 (two) times daily.  Dispense: 60 tablet; Refill: 0 - Ambulatory referral to Psychiatry  2. Severe dysplasia of cervix (CIN III)  Had LEEP in 2016 but do not see follow up PAP documented. Have told patient she should have this done in the near future to see if there are any precancerous cells.    I have spent 40 minutes with this patient, 50% of which was spent on counseling and coordination of care.       Trey Sailors, PA-C  [facility]

## 2018-04-08 ENCOUNTER — Telehealth: Payer: Self-pay

## 2018-04-08 NOTE — Telephone Encounter (Signed)
[facility] has denied the referral suggested [facility]. Patient has been seen by [facility] in the past. She is welcome to go back to them. May also seek care from [facility] which operates on walk in basis. Their contact number is also [phone]. Can we please call her and tell her status of referral?

## 2018-04-08 NOTE — Telephone Encounter (Signed)
Noted, thanks!

## 2018-04-08 NOTE — Telephone Encounter (Signed)
FYI...  Pt advised.  She states she had called another place (She can't remember the name) and they will call her back once they verify her insurance.   Thanks,   -Vernona RiegerLaura

## 2018-04-29 ENCOUNTER — Ambulatory Visit: Payer: Medicaid Other | Admitting: Physician Assistant

## 2018-04-29 ENCOUNTER — Inpatient Hospital Stay (HOSPITAL_COMMUNITY)
Admission: AD | Admit: 2018-04-29 | Discharge: 2018-04-29 | Disposition: A | Discharge status: Home / Self Care | Payer: Medicaid Other | Source: Ambulatory Visit | Attending: Obstetrics and Gynecology | Admitting: Obstetrics and Gynecology

## 2018-04-29 ENCOUNTER — Encounter (HOSPITAL_COMMUNITY): Payer: Self-pay

## 2018-04-29 DIAGNOSIS — O2341 Unspecified infection of urinary tract in pregnancy, first trimester: Secondary | ICD-10-CM | POA: Diagnosis not present

## 2018-04-29 DIAGNOSIS — Z3A01 Less than 8 weeks gestation of pregnancy: Secondary | ICD-10-CM | POA: Diagnosis not present

## 2018-04-29 DIAGNOSIS — O210 Mild hyperemesis gravidarum: Secondary | ICD-10-CM | POA: Insufficient documentation

## 2018-04-29 DIAGNOSIS — K920 Hematemesis: Secondary | ICD-10-CM | POA: Diagnosis present

## 2018-04-29 DIAGNOSIS — O219 Vomiting of pregnancy, unspecified: Secondary | ICD-10-CM

## 2018-04-29 DIAGNOSIS — Z880 Allergy status to penicillin: Secondary | ICD-10-CM | POA: Insufficient documentation

## 2018-04-29 LAB — URINALYSIS, ROUTINE W REFLEX MICROSCOPIC
Bilirubin Urine: NEGATIVE
GLUCOSE, UA: NEGATIVE mg/dL
Ketones, ur: 20 mg/dL — AB
Nitrite: NEGATIVE
PH: 5 (ref 5.0–8.0)
Protein, ur: NEGATIVE mg/dL
SPECIFIC GRAVITY, URINE: 1.018 (ref 1.005–1.030)

## 2018-04-29 LAB — POCT PREGNANCY, URINE: Preg Test, Ur: POSITIVE — AB

## 2018-04-29 MED ORDER — METOCLOPRAMIDE HCL 10 MG PO TABS
10.0000 mg | ORAL_TABLET | Freq: Once | ORAL | Status: AC
  Administered 2018-04-29: 10 mg via ORAL
  Filled 2018-04-29: qty 1

## 2018-04-29 MED ORDER — VITAMIN B-6 25 MG PO TABS: 25.0000 mg | ORAL_TABLET | Freq: Three times a day (TID) | ORAL | 0 refills | Status: DC | PRN

## 2018-04-29 MED ORDER — SULFAMETHOXAZOLE-TRIMETHOPRIM 800-160 MG PO TABS: 1.0000 | ORAL_TABLET | Freq: Two times a day (BID) | ORAL | 0 refills | Status: AC

## 2018-04-29 MED ORDER — DOXYLAMINE SUCCINATE (SLEEP) 25 MG PO TABS: 25.0000 mg | ORAL_TABLET | Freq: Three times a day (TID) | ORAL | 0 refills | Status: DC | PRN

## 2018-04-29 NOTE — MAU Provider Note (Signed)
History     CSN: [id number]  Arrival date and time: 04/29/18 1507   First Provider Initiated Contact with Patient 04/29/18 1641      Chief Complaint  Patient presents with  . Hematemesis   HPI  Joanna Padilla is a 32 y.o. [id number] at [redacted]w[redacted]d by LMP who presents to MAU with chief complaint of strands of blood in her saliva after episodes of vomiting. New onset today. Patient states she has been on self-imposed bed rest for the past week due to constant nausea/vomiting. Unaware of aggravating or alleviating factors.   Patient states that she has episodes of vomiting due to feeling better then "giving in to food cravings".   Denies use of medications for management. Denies vaginal bleeding, leaking of fluid, fever, falls, or recent illness.    OB History    Gravida  4   Para  2   Term  2   Preterm      AB  1   Living  2     SAB      TAB  1   Ectopic      Multiple      Live Births  2           Past Medical History:  Diagnosis Date  . Abnormal Pap smear   . Acute bronchitis   . Anxiety   . Asthma   . Chlamydia infection   . Infection    UTI  . Obesity, unspecified   . Seasonal allergies   . Sickle cell trait (HCC)   . Unspecified asthma(493.90)   . Urinary tract infection     Past Surgical History:  Procedure Laterality Date  . ABDOMINAL SURGERY     c-section  . CESAREAN SECTION    . CESAREAN SECTION    . LEEP  07/2015  . WISDOM TOOTH EXTRACTION      Family History  Problem Relation Age of Onset  . Hyperlipidemia Mother   . Transient ischemic attack Mother   . Mental illness Mother   . Diabetes Mother   . Hypertension Mother   . Congestive Heart Failure Mother   . Diabetes Maternal Grandmother   . Hypertension Maternal Grandmother     Social History   Tobacco Use  . Smoking status: Never Smoker  . Smokeless tobacco: Never Used  Substance Use Topics  . Alcohol use: No  . Drug use: No    Allergies:  Allergies  Allergen Reactions   . Penicillins Anaphylaxis and Hives    Has patient had a PCN reaction causing immediate rash, facial/tongue/throat swelling, SOB or lightheadedness with hypotension: Yes Has patient had a PCN reaction causing severe rash involving mucus membranes or skin necrosis: No Has patient had a PCN reaction that required hospitalization No Has patient had a PCN reaction occurring within the last 10 years: No If all of the above answers are "NO", then may proceed with Cephalosporin use.   Marland Kitchen Penicillins Hives    Medications Prior to Admission  Medication Sig Dispense Refill Last Dose  . albuterol (PROVENTIL HFA;VENTOLIN HFA) 108 (90 BASE) MCG/ACT inhaler Inhale 1-2 puffs into the lungs every 6 (six) hours as needed for wheezing or shortness of breath.   Taking  . cetirizine (ZYRTEC) 10 MG tablet Take 10 mg by mouth daily.   Taking  . escitalopram (LEXAPRO) 10 MG tablet Take 1 tablet (10 mg total) by mouth daily. (Patient not taking: Reported on 03/27/2018) 90 tablet 0 Not Taking  .  metroNIDAZOLE (FLAGYL) 500 MG tablet Take 1 tablet (500 mg total) by mouth 2 (two) times daily. 14 tablet 0 Taking  . phenazopyridine (PYRIDIUM) 200 MG tablet Take 1 tablet (200 mg total) by mouth 3 (three) times daily. 6 tablet 0 Taking    Review of Systems  Constitutional: Negative for chills and fever.  Respiratory: Negative for shortness of breath.   Gastrointestinal: Positive for nausea and vomiting. Negative for constipation and diarrhea.  Genitourinary: Negative for difficulty urinating, vaginal bleeding, vaginal discharge and vaginal pain.  Neurological: Negative for headaches.  All other systems reviewed and are negative.  Physical Exam   Blood pressure (!) 122/51, pulse 61, temperature 98.4 F (36.9 C), resp. rate 16, weight 204 lb (92.5 kg), last menstrual period 03/18/2018, unknown if currently breastfeeding.  Physical Exam  Nursing note and vitals reviewed. Constitutional: She is oriented to person,  place, and time. She appears well-developed and well-nourished.  Cardiovascular: Normal rate, regular rhythm, normal heart sounds and intact distal pulses.  Respiratory: Effort normal and breath sounds normal.  GI: Soft. Bowel sounds are normal.  Musculoskeletal: Normal range of motion.  Neurological: She is alert and oriented to person, place, and time. She has normal reflexes.  Skin: Skin is warm and dry.  Psychiatric: She has a normal mood and affect. Her behavior is normal. Judgment and thought content normal.   Normal skin turgor Mucus membranes pink and moist  MAU Course  Procedures  MDM --Nausea responding to Reglan given in MAU --Tolerating PO with poor food selection --Per patient, she has gained one pound since pregnancy diagnosis --No physical signs of dehydration  Patient Vitals for the past 24 hrs:  BP Temp Pulse Resp Weight  04/29/18 1744 117/62 - 63 16 -  04/29/18 1551 (!) 122/51 98.4 F (36.9 C) 61 16 204 lb (92.5 kg)   Orders Placed This Encounter  Procedures  . Culture, OB Urine  . Urinalysis, Routine w reflex microscopic  . Pregnancy, urine POC  . Discharge patient   Results for orders placed or performed during the hospital encounter of 04/29/18 (from the past 24 hour(s))  Urinalysis, Routine w reflex microscopic     Status: Abnormal   Collection Time: 04/29/18  4:08 PM  Result Value Ref Range   Color, Urine YELLOW YELLOW   APPearance HAZY (A) CLEAR   Specific Gravity, Urine 1.018 1.005 - 1.030   pH 5.0 5.0 - 8.0   Glucose, UA NEGATIVE NEGATIVE mg/dL   Hgb urine dipstick SMALL (A) NEGATIVE   Bilirubin Urine NEGATIVE NEGATIVE   Ketones, ur 20 (A) NEGATIVE mg/dL   Protein, ur NEGATIVE NEGATIVE mg/dL   Nitrite NEGATIVE NEGATIVE   Leukocytes, UA LARGE (A) NEGATIVE   RBC / HPF 11-20 0 - 5 RBC/hpf   WBC, UA >50 (H) 0 - 5 WBC/hpf   Bacteria, UA FEW (A) NONE SEEN   Squamous Epithelial / LPF 6-10 0 - 5   Mucus PRESENT   Pregnancy, urine POC     Status:  Abnormal   Collection Time: 04/29/18  4:20 PM  Result Value Ref Range   Preg Test, Ur POSITIVE (A) NEGATIVE   Meds ordered this encounter  Medications  . metoCLOPramide (REGLAN) tablet 10 mg  . vitamin B-6 (PYRIDOXINE) 25 MG tablet    Sig: Take 1 tablet (25 mg total) by mouth every 8 (eight) hours as needed.    Dispense:  30 tablet    Refill:  0    Order Specific Question:  Supervising Provider    Answer:   Reva BoresPRATT, TANYA S [[id number]]  . doxylamine, Sleep, (UNISOM) 25 MG tablet    Sig: Take 1 tablet (25 mg total) by mouth every 8 (eight) hours as needed.    Dispense:  30 tablet    Refill:  0    Order Specific Question:   Supervising Provider    Answer:   Reva BoresPRATT, TANYA S [[id number]]  . sulfamethoxazole-trimethoprim (BACTRIM DS,SEPTRA DS) 800-160 MG tablet    Sig: Take 1 tablet by mouth 2 (two) times daily for 7 days.    Dispense:  14 tablet    Refill:  0    Order Specific Question:   Supervising Provider    Answer:   Reva BoresPRATT, TANYA S [[id number]]    Assessment and Plan  --32 y.o. [phone] at 6825w0d  --Nausea and vomiting in pregnancy  --UTI in pregnancy, PCN allergic  --Discussed diet revision to manage nausea including but not limited to choosing tolerable bland foods     instead of cravings, solid food before liquid, small frequent meals --rx for B-6/Unisom sent to pharmacy on record --rx for Bactrim sent to pharmacy on record --Discharge home in stable condition  Calvert CantorSamantha C Adamae Ricklefs, CNM 04/29/2018, 6:08 PM

## 2018-04-29 NOTE — MAU Note (Signed)
Pt presents to MAU with complaints of vomiting up blood. States she called her provider today because she had an appointment toady at 3:40 but was told to come here. Reports generalized pain. Denies any VB

## 2018-04-29 NOTE — MAU Note (Signed)
Pt was very reluctant to discuss her OB and medical history.

## 2018-04-29 NOTE — Discharge Instructions (Signed)

## 2018-04-30 LAB — CULTURE, OB URINE: Culture: <10000 — AB

## 2018-05-01 ENCOUNTER — Ambulatory Visit: Payer: Medicaid Other | Admitting: Physician Assistant

## 2018-05-17 ENCOUNTER — Other Ambulatory Visit (HOSPITAL_COMMUNITY)
Admission: RE | Admit: 2018-05-17 | Discharge: 2018-05-17 | Disposition: A | Discharge status: Home / Self Care | Payer: Medicaid Other | Source: Ambulatory Visit | Attending: Physician Assistant | Admitting: Physician Assistant

## 2018-05-17 ENCOUNTER — Encounter: Payer: Self-pay | Admitting: Physician Assistant

## 2018-05-17 ENCOUNTER — Ambulatory Visit (INDEPENDENT_AMBULATORY_CARE_PROVIDER_SITE_OTHER): Payer: Medicaid Other | Admitting: Physician Assistant

## 2018-05-17 VITALS — BP 114/72 | HR 74 | Temp 99.0°F | Wt 206.0 lb

## 2018-05-17 DIAGNOSIS — N760 Acute vaginitis: Secondary | ICD-10-CM

## 2018-05-17 DIAGNOSIS — R300 Dysuria: Secondary | ICD-10-CM | POA: Diagnosis not present

## 2018-05-17 DIAGNOSIS — Z7251 High risk heterosexual behavior: Secondary | ICD-10-CM | POA: Insufficient documentation

## 2018-05-17 DIAGNOSIS — B9689 Other specified bacterial agents as the cause of diseases classified elsewhere: Secondary | ICD-10-CM

## 2018-05-17 DIAGNOSIS — Z3490 Encounter for supervision of normal pregnancy, unspecified, unspecified trimester: Secondary | ICD-10-CM

## 2018-05-17 LAB — POCT URINALYSIS DIPSTICK
Bilirubin, UA: NEGATIVE
Glucose, UA: NEGATIVE
Ketones, UA: NEGATIVE
Nitrite, UA: NEGATIVE
Protein, UA: POSITIVE — AB
Spec Grav, UA: 1.025 (ref 1.010–1.025)
Urobilinogen, UA: 0.2 E.U./dL
pH, UA: 5 (ref 5.0–8.0)

## 2018-05-17 MED ORDER — METRONIDAZOLE 500 MG PO TABS: 500.0000 mg | ORAL_TABLET | Freq: Two times a day (BID) | ORAL | 0 refills | Status: AC

## 2018-05-17 NOTE — Progress Notes (Signed)
[facility]  No chief complaint on file.   Subjective:    Patient ID: Joanna Padilla, female    DOB: Mar 06, 1986, 32 y.o.   MRN: [id number]   Urinary Tract Infection: Patient with history of BV complains of foul smelling urine. Says she had massive UTI diagnosed at the [facility] via dipstick though urine culture came back insignificant growth < 10,000 CFU. She was treated with bactrim regardless. She thinks she continues to have a UTI because when she drinks water she has to go to the bathroom.  At the time she was pregnant but she has since had an abortion in [place]. She is currently taking lo loestrin, she does not know the dose, she got a year's supply from the gynecology clinic. Does have history of BV and does have foul smelling discharge. Also had unprotected sex and is concerned about STDs.   She is also asking about labwork because I have not drawn any yet. She missed her follow up appointment with PAP and labwork.   Wants to know if her birth control is "messing with her hormones."   Review of Systems  Constitutional: Negative.   Gastrointestinal: Positive for constipation. Negative for abdominal pain, blood in stool, diarrhea, heartburn, melena, nausea and vomiting.  Genitourinary: Positive for frequency. Negative for dysuria, flank pain, hematuria and urgency.  Neurological: Negative for dizziness and headaches.       Objective:   BP 114/72 (BP Location: Right Arm, Patient Position: Sitting, Cuff Size: Large)   Pulse 74   Temp 99 F (37.2 C) (Oral)   Wt 206 lb (93.4 kg)   LMP 03/18/2018   SpO2 98%   BMI 36.49 kg/m   Patient Active Problem List   Diagnosis Date Noted  . Severe dysplasia of cervix (CIN III) 07/14/2015  . Pap smear abnormality of cervix with LGSIL 05/27/2013  . Sickle cell trait (HCC) 03/09/2013  . Allergy to penicillin 03/09/2013  . OBESITY 12/26/2010  . ADJUSTMENT DISORDER 12/26/2010  . ASTHMA 12/26/2010     Outpatient Encounter Medications as of 05/17/2018  Medication Sig  . doxylamine, Sleep, (UNISOM) 25 MG tablet Take 1 tablet (25 mg total) by mouth every 8 (eight) hours as needed.  . vitamin B-6 (PYRIDOXINE) 25 MG tablet Take 1 tablet (25 mg total) by mouth every 8 (eight) hours as needed. (Patient not taking: Reported on 05/17/2018)   No facility-administered encounter medications on file as of 05/17/2018.     Allergies  Allergen Reactions  . Penicillins Anaphylaxis and Hives    Has patient had a PCN reaction causing immediate rash, facial/tongue/throat swelling, SOB or lightheadedness with hypotension: Yes Has patient had a PCN reaction causing severe rash involving mucus membranes or skin necrosis: No Has patient had a PCN reaction that required hospitalization No Has patient had a PCN reaction occurring within the last 10 years: No If all of the above answers are "NO", then may proceed with Cephalosporin use.   Marland Kitchen Penicillins Hives       Physical Exam  Constitutional: She is oriented to person, place, and time. She appears well-developed and well-nourished.  Cardiovascular: Normal rate and regular rhythm.  Pulmonary/Chest: Effort normal and breath sounds normal.  Abdominal: Soft. Bowel sounds are normal.  Neurological: She is alert and oriented to person, place, and time.  Skin: Skin is warm and dry.  Psychiatric: She has a normal mood and affect. Her behavior is normal.       Assessment &  Plan:  1. Pregnancy, unspecified gestational age  No longer pregnant, underwent abortion in [place]. Regarding her labwork she needs to schedule a follow up visit with PAP as she missed her las appointment.  - POCT Urinalysis Dipstick  2. Dysuria  Urine dipstick and symptoms not overly convincing for infection. Will send for culture.   - CULTURE, URINE COMPREHENSIVE  3. Bacterial vaginosis  Filled as below, she feels she may be having another flare.  - metroNIDAZOLE (FLAGYL)  500 MG tablet; Take 1 tablet (500 mg total) by mouth 2 (two) times daily for 7 days.  Dispense: 14 tablet; Refill: 0  4. Unprotected sex  Come back to give sample. Cup provided and instructions. Do not think her birth control his messing with her hormones, likely the sequelae of her untreated psychiatric conditions. She should take her birth control if she does not desire pregnancy.  - Urine cytology ancillary only  I have spent 25 minutes with this patient, >50% of which was spent on counseling and coordination of care.  The entirety of the information documented in the History of Present Illness, Review of Systems and Physical Exam were personally obtained by me. Portions of this information were initially documented by Kavin LeechLaura Walsh, CMA and reviewed by me for thoroughness and accuracy.   Return in about 1 month (around 06/17/2018) for PAP AND LABWORK .

## 2018-05-17 NOTE — Patient Instructions (Signed)

## 2018-05-20 LAB — URINE CYTOLOGY ANCILLARY ONLY
Chlamydia: NEGATIVE
Neisseria Gonorrhea: NEGATIVE
Trichomonas: POSITIVE — AB

## 2018-05-21 LAB — CULTURE, URINE COMPREHENSIVE

## 2018-05-23 ENCOUNTER — Telehealth: Payer: Self-pay | Admitting: Physician Assistant

## 2018-05-23 ENCOUNTER — Telehealth: Payer: Self-pay

## 2018-05-23 NOTE — Telephone Encounter (Signed)
-----   Message from Adriana M Pollak, PA-C sent at 05/21/2018  4:49 PM EDT ----- No growth on urine culture. 

## 2018-05-23 NOTE — Telephone Encounter (Signed)
Notes recorded by Trey SailorsPollak, Adriana M, PA-C on 05/21/2018 at 12:09 PM EDT STI testing is positive for trichomonas. The metronidazole I gave her should cover for this and she should take the entire prescription. Her sexual partners should be treated and she should be retested in 3 months. Urine culture pending.

## 2018-05-23 NOTE — Telephone Encounter (Signed)
-----   Message from Trey SailorsAdriana M Pollak, [place]PA-C sent at 05/21/2018  4:49 PM EDT ----- No growth on urine culture.

## 2018-05-23 NOTE — Telephone Encounter (Signed)
Pt advised.

## 2018-05-23 NOTE — Telephone Encounter (Signed)
Pt stated that she was speaking with Vernona RiegerLaura this morning about her results and she was asking Vernona RiegerLaura a question when the phone went out. Pt is requesting call back to discuss her questions with Vernona RiegerLaura. Please advise. Thanks TNP

## 2018-05-27 NOTE — Telephone Encounter (Signed)
Spoke with pt about lab results.

## 2018-06-14 ENCOUNTER — Other Ambulatory Visit (HOSPITAL_COMMUNITY)
Admission: RE | Admit: 2018-06-14 | Discharge: 2018-06-14 | Disposition: A | Discharge status: Home / Self Care | Payer: Medicaid Other | Source: Ambulatory Visit | Attending: Physician Assistant | Admitting: Physician Assistant

## 2018-06-14 ENCOUNTER — Encounter: Payer: Self-pay | Admitting: Physician Assistant

## 2018-06-14 ENCOUNTER — Ambulatory Visit (INDEPENDENT_AMBULATORY_CARE_PROVIDER_SITE_OTHER): Payer: Medicaid Other | Admitting: Physician Assistant

## 2018-06-14 VITALS — BP 120/70 | HR 80 | Temp 98.5°F | Resp 16 | Ht 63.0 in | Wt 204.0 lb

## 2018-06-14 DIAGNOSIS — Z131 Encounter for screening for diabetes mellitus: Secondary | ICD-10-CM | POA: Diagnosis not present

## 2018-06-14 DIAGNOSIS — D069 Carcinoma in situ of cervix, unspecified: Secondary | ICD-10-CM | POA: Insufficient documentation

## 2018-06-14 DIAGNOSIS — Z1151 Encounter for screening for human papillomavirus (HPV): Secondary | ICD-10-CM | POA: Diagnosis not present

## 2018-06-14 DIAGNOSIS — F419 Anxiety disorder, unspecified: Secondary | ICD-10-CM | POA: Insufficient documentation

## 2018-06-14 DIAGNOSIS — F329 Major depressive disorder, single episode, unspecified: Secondary | ICD-10-CM

## 2018-06-14 DIAGNOSIS — Z13220 Encounter for screening for lipoid disorders: Secondary | ICD-10-CM

## 2018-06-14 DIAGNOSIS — A630 Anogenital (venereal) warts: Secondary | ICD-10-CM | POA: Insufficient documentation

## 2018-06-14 DIAGNOSIS — Z1329 Encounter for screening for other suspected endocrine disorder: Secondary | ICD-10-CM

## 2018-06-14 DIAGNOSIS — D573 Sickle-cell trait: Secondary | ICD-10-CM | POA: Diagnosis not present

## 2018-06-14 DIAGNOSIS — F32A Depression, unspecified: Secondary | ICD-10-CM | POA: Insufficient documentation

## 2018-06-14 NOTE — Progress Notes (Signed)
Patient: Joanna Padilla Female    DOB: 1986/10/08   32 y.o.   MRN: [id number] Visit Date: 06/14/2018  Today's Provider: Trey Sailors, PA-C   Chief Complaint  Patient presents with  . Gynecologic Exam   Subjective:    HPI   Presents today for a PAP smear. Patient had LEEP 07/2015 for CIN III with positive margins done by Dr. Vela Prose at [facility]. Instructions for repeat PAP and ECC vs repeat LEEP in 4-6 months. She reports she went back for this visit but there is no record in Epic. She reports somebody took a Insurance underwriter" and looked at her cervic and told her she was fine. Did not report a biopsy or another excision. Does not report undergoing another PAP smear at this time. Denies Pap smear in the past year. Denies vaginal bleeding. Has recently completed course of treatment for trichomonas.   She also reports a "mole" that has appeared in her genital region over the past two years. She says it is the only on she has noticed, has not gotten bigger since then.   Regarding her anxiety and depression, [facility] has refused her referral. She continues to have ongoing issues with this. She says she plans on establishing with Dr. Evelene Croon in [place].      Allergies  Allergen Reactions  . Penicillins Anaphylaxis and Hives    Has patient had a PCN reaction causing immediate rash, facial/tongue/throat swelling, SOB or lightheadedness with hypotension: Yes Has patient had a PCN reaction causing severe rash involving mucus membranes or skin necrosis: No Has patient had a PCN reaction that required hospitalization No Has patient had a PCN reaction occurring within the last 10 years: No If all of the above answers are "NO", then may proceed with Cephalosporin use.   Marland Kitchen Penicillins Hives     Current Outpatient Medications:  .  LO LOESTRIN FE 1 MG-10 MCG / 10 MCG tablet, TK 1 T PO D, Disp: , Rfl: 0  Review of Systems  Constitutional: Negative.     Respiratory: Negative.   Cardiovascular: Negative.   Genitourinary: Negative.     Social History   Tobacco Use  . Smoking status: Never Smoker  . Smokeless tobacco: Never Used  Substance Use Topics  . Alcohol use: No   Objective:   BP 120/70 (BP Location: Left Arm, Patient Position: Sitting, Cuff Size: Large)   Pulse 80   Temp 98.5 F (36.9 C) (Oral)   Resp 16   Ht 5\' 3"  (1.6 m)   Wt 204 lb (92.5 kg)   LMP 03/18/2018   SpO2 98%   Breastfeeding? Unknown   BMI 36.14 kg/m  Vitals:   06/14/18 0950  BP: 120/70  Pulse: 80  Resp: 16  Temp: 98.5 F (36.9 C)  TempSrc: Oral  SpO2: 98%  Weight: 204 lb (92.5 kg)  Height: 5\' 3"  (1.6 m)     Physical Exam  Constitutional: She is oriented to person, place, and time. She appears well-developed and well-nourished.  Genitourinary: Uterus normal. Cervix exhibits discharge. Cervix exhibits no motion tenderness and no friability. Right adnexum displays no mass, no tenderness and no fullness. Left adnexum displays no mass, no tenderness and no fullness. No erythema, tenderness or bleeding in the vagina. No foreign body in the vagina. No signs of injury around the vagina. No vaginal discharge found.  Genitourinary Comments: Greenish appearing cervical discharge. Cervix is not friable.   Neurological: She is  alert and oriented to person, place, and time.  Skin: Skin is warm and dry.     Psychiatric: She has a normal mood and affect. Her behavior is normal.        Assessment & Plan:     1. Severe dysplasia of cervix (CIN III)  Have had extensive discussion about proper follow up regarding CIN III. At this point, I am not entirely convinced that she has had appropriate follow up regarding this. Will get PAP and HPV today as well as G/C and trichomonas as she has just been treated for trichomonas.   - Cytology - PAP - Ambulatory referral to Obstetrics / Gynecology  2. Diabetes mellitus screening  - Comprehensive Metabolic Panel  (CMET)  3. Sickle cell trait (HCC)  - CBC with Differential  4. Screening cholesterol level  - Lipid Profile  5. Thyroid disorder screen  - TSH  6. Genital warts  The lesion patient refers to as a mole appears to be a genital wart. Explained to patient that this is caused from the human papilloma virus (HPV), has had CIN III in the past as well. Refer to OBGYN for genital wart removal.  - Ambulatory referral to Obstetrics / Gynecology  7. Anxiety and depression  [facility] refused referral. Have redirected her to [facility] or [facility], offered to refer to [place]. Patient declines, says [facility] seems "racial" to her and that she will establish with Dr. Evelene Croon in [place].   I have spent 25 minutes with this patient, >50% of which was spent on counseling and coordination of care.  Return if symptoms worsen or fail to improve.  The entirety of the information documented in the History of Present Illness, Review of Systems and Physical Exam were personally obtained by me. Portions of this information were initially documented by Rondel Baton, CMA and reviewed by me for thoroughness and accuracy.         Trey Sailors, PA-C  [facility]

## 2018-06-14 NOTE — Patient Instructions (Signed)
Cervical Dysplasia Cervical dysplasia is a condition in which a woman's cervix cells have abnormal changes. The cervix is the opening of the uterus (womb). It is located between the vagina and the uterus. Cervical dysplasia may be an early sign of cervical cancer. If left untreated, this condition may become more severe and may progress to cervical cancer. Early detection, treatment, and follow-up care are very important. What are the causes? Cervical dysplasia can be caused by a human papillomavirus (HPV) infection. HPV is the most common sexually transmitted infection (STI). HPV is spread from person to person through sexual contact. This includes oral, vaginal, or anal sex. What increases the risk? The following factors may make you more likely to develop this condition:  Having had a sexually transmitted infection (STI), such as herpes, chlamydia or HPV.  Becoming sexually active before age 18.  Having had more than one sexual partner.  Having a sexual partner who has multiple sexual partners.  Not using protection, such as a condom, during sex, especially with new sexual partners.  Having a history of cancer of the vagina or vulva.  Having a weakened body defense (immune) system.  Being the daughter of a woman who took diethylstilbestrol (DES) during pregnancy.  Having a family history of cervical cancer.  Smoking.  Using oral contraceptives, also called birth control pills.  Having had three or more full-term pregnancies.  What are the signs or symptoms? There are usually no symptoms of this condition. If you do have symptoms, they may include:  Abnormal vaginal discharge.  Bleeding between periods or after sex.  Bleeding during menopause.  Pain during sex (dyspareunia).  How is this diagnosed? A test called a Pap test may be done. During this test, cells are taken from the cervix and then examined under a microscope. A test in which tissue is removed from the cervix  (biopsy) may also be done if the Pap test is abnormal or if the cervix looks abnormal. How is this treated? Treatment varies based on the severity of the condition. Treatment may include:  Cryotherapy. During cryotherapy, the abnormal cells are frozen with a steel-tip instrument.  Loop electrosurgical excision procedure (LEEP). This procedure removes abnormal tissue from the cervix.  Surgery to remove abnormal tissue. This is usually done in more advanced cases. Surgical options include: ? A cone biopsy. This is a procedure in which the cervical canal and a portion of the center of the cervix are removed. ? Hysterectomy. This is a surgery in which the uterus and cervix are removed.  Follow these instructions at home:  Take over-the-counter and prescription medicines only as told by your health care provider.  Do not use tampons, have sex, or douche until your health care provider says it is okay.  Keep follow-up visits as told by your health care provider. This is important. Women who have been treated for cervical dysplasia should have regular pelvic exams and Pap tests as told by their health care provider. How is this prevented?  Practice safe sex to help prevent sexually transmitted infections (STI) that may cause this condition.  Have regular Pap tests. Talk with your health care provider about how often you need these tests. Pap tests will help identify cell changes that can lead to cancer. Contact a health care provider if:  You develop genital warts. Get help right away if:  You have a fever.  You have abnormal vaginal discharge.  Your menstrual period is heavier than normal.  You develop bright   red bleeding. This may include blood clots.  You have pain or cramps that get worse, and medicine does not help to relieve your pain.  You feel light-headed and you are unusually weak.  You have fainting spells.  You have pain in the abdomen. Summary  Cervical dysplasia  is a condition in which a woman's cervix cells have abnormal changes.  If left untreated, this condition may become more severe and may progress to cervical cancer.  Early detection, treatment, and follow-up care are very important in managing this condition.  Have regular pelvic exams and Pap tests. Talk with your health care provider about how often you need these tests. Pap tests will help identify cell changes that can lead to cancer. This information is not intended to replace advice given to you by your health care provider. Make sure you discuss any questions you have with your health care provider. Document Released: 09/25/2005 Document Revised: 09/28/2016 Document Reviewed: 09/28/2016 Elsevier Interactive Patient Education  2017 Elsevier Inc.  

## 2018-06-19 LAB — CYTOLOGY - PAP
Chlamydia: NEGATIVE
Diagnosis: UNDETERMINED — AB
HPV 16/18/45 genotyping: NEGATIVE
HPV: DETECTED — AB
Neisseria Gonorrhea: NEGATIVE
Trichomonas: NEGATIVE

## 2018-07-01 ENCOUNTER — Encounter: Payer: Self-pay | Admitting: Obstetrics and Gynecology

## 2018-07-01 ENCOUNTER — Ambulatory Visit (INDEPENDENT_AMBULATORY_CARE_PROVIDER_SITE_OTHER): Payer: Medicaid Other | Admitting: Obstetrics and Gynecology

## 2018-07-01 VITALS — BP 180/70 | HR 72 | Ht 63.0 in | Wt 206.0 lb

## 2018-07-01 DIAGNOSIS — B373 Candidiasis of vulva and vagina: Secondary | ICD-10-CM | POA: Diagnosis not present

## 2018-07-01 DIAGNOSIS — B3731 Acute candidiasis of vulva and vagina: Secondary | ICD-10-CM

## 2018-07-01 MED ORDER — FLUCONAZOLE 150 MG PO TABS: 150.0000 mg | ORAL_TABLET | ORAL | 0 refills | Status: AC

## 2018-07-01 NOTE — Progress Notes (Signed)
Patient ID: Joanna Padilla, female   DOB: Dec 24, 1985, 32 y.o.   MRN: [id number]  Reason for Consult: Referral ([facility]   Referred by Trey SailorsPollak, Adriana M, PA-C  Subjective:     HPI:  Joanna Padilla is a 32 y.o. female . She presents today for follow up of her pap smears. She had a LEEP in 2016. She says that it was [place] in [place]. She does not know what the result was. She recently had an abnormal pap 2 weeks ago that showed ASCUS HPV + 16/18 negative. She reports being treated recently for BV and trichomonas. She says she was not treated for a yeast infection. She uses soap while showering to vigorously wash her vagina and try to eliminate odor.    Past Medical History:  Diagnosis Date  . Abnormal Pap smear   . Acute bronchitis   . Anxiety   . Asthma   . Chlamydia infection   . Infection    UTI  . Obesity, unspecified   . Seasonal allergies   . Sickle cell trait (HCC)   . Unspecified asthma(493.90)   . Urinary tract infection    Family History  Problem Relation Age of Onset  . Hyperlipidemia Mother   . Transient ischemic attack Mother   . Mental illness Mother   . Diabetes Mother   . Hypertension Mother   . Congestive Heart Failure Mother   . Diabetes Maternal Grandmother   . Hypertension Maternal Grandmother    Past Surgical History:  Procedure Laterality Date  . ABDOMINAL SURGERY     c-section  . CERVICAL BIOPSY  W/ LOOP ELECTRODE EXCISION  2016  . CESAREAN SECTION    . CESAREAN SECTION    . LEEP  07/2015  . WISDOM TOOTH EXTRACTION      Short Social History:  Social History   Tobacco Use  . Smoking status: Never Smoker  . Smokeless tobacco: Never Used  Substance Use Topics  . Alcohol use: No    Allergies  Allergen Reactions  . Penicillins Anaphylaxis and Hives    Has patient had a PCN reaction causing immediate rash, facial/tongue/throat swelling, SOB or lightheadedness with hypotension: Yes Has patient  had a PCN reaction causing severe rash involving mucus membranes or skin necrosis: No Has patient had a PCN reaction that required hospitalization No Has patient had a PCN reaction occurring within the last 10 years: No If all of the above answers are "NO", then may proceed with Cephalosporin use.   . Latex Itching  . Penicillins Hives    Current Outpatient Medications  Medication Sig Dispense Refill  . LO LOESTRIN FE 1 MG-10 MCG / 10 MCG tablet TK 1 T PO D  0  . fluconazole (DIFLUCAN) 150 MG tablet Take 1 tablet (150 mg total) by mouth every 3 (three) days for 2 doses. 2 tablet 0   No current facility-administered medications for this visit.     Review of Systems  Constitutional: Negative for chills, fatigue, fever and unexpected weight change.  HENT: Negative for trouble swallowing.  Eyes: Negative for loss of vision.  Respiratory: Negative for cough, shortness of breath and wheezing.  Cardiovascular: Negative for chest pain, leg swelling, palpitations and syncope.  GI: Negative for abdominal pain, blood in stool, diarrhea, nausea and vomiting.  GU: Negative for difficulty urinating, dysuria, frequency and hematuria.  Musculoskeletal: Negative for back pain, leg pain and joint pain.  Skin: Negative  for rash.  Neurological: Negative for dizziness, headaches, light-headedness, numbness and seizures.  Psychiatric: Negative for behavioral problem, confusion, depressed mood and sleep disturbance.        Objective:  Objective   Vitals:   07/01/18 0830  BP: (!) 180/70  Pulse: 72  Weight: 206 lb (93.4 kg)  Height: 5\' 3"  (1.6 m)   Body mass index is 36.49 kg/m.  Physical Exam  Constitutional: She is oriented to person, place, and time. She appears well-developed and well-nourished.  HENT:  Head: Normocephalic and atraumatic.  Eyes: Pupils are equal, round, and reactive to light. EOM are normal.  Cardiovascular: Normal rate and regular rhythm.  Pulmonary/Chest: Effort  normal. No respiratory distress.  Neurological: She is alert and oriented to person, place, and time.  Skin: Skin is warm and dry.  Psychiatric: She has a normal mood and affect. Her behavior is normal. Judgment and thought content normal.  Nursing note and vitals reviewed.       Assessment/Plan:     32 yo  Will have her return for a colposcopy to follow up her ASCUS HPV + pap smear.  Will treat yeast infection with diflucan.  Can consider following up in 3 months to retest for vaginal infections.   More than 25 minutes were spent face to face with the patient in the room with more than 50% of the time spent providing counseling and discussing the plan of management. We discussed general hygiene practices to avoid vaginal infections. This included avoiding the use of soap to scrub her vagina. We discussed use of a probiotic with lactobacillus, like rephresh, and increasing the amount of foods in your diet that promote a healthy gut microbiome such as broccoli, brussle sprouts, cauliflower, sauerkraut, sour dough bread, kimchi, kombucha, yogurt, cheeses, etc.      Adelene Idler MD [facility] 07/01/18 9:04 AM

## 2018-07-15 ENCOUNTER — Ambulatory Visit: Payer: Medicaid Other | Admitting: Obstetrics and Gynecology

## 2018-11-13 ENCOUNTER — Encounter: Payer: Self-pay | Admitting: Physician Assistant

## 2018-11-13 ENCOUNTER — Ambulatory Visit (INDEPENDENT_AMBULATORY_CARE_PROVIDER_SITE_OTHER): Payer: Self-pay | Admitting: Physician Assistant

## 2018-11-13 VITALS — BP 115/80 | HR 100 | Temp 99.1°F | Resp 16 | Wt 200.6 lb

## 2018-11-13 DIAGNOSIS — R059 Cough, unspecified: Secondary | ICD-10-CM

## 2018-11-13 DIAGNOSIS — R05 Cough: Secondary | ICD-10-CM

## 2018-11-13 DIAGNOSIS — R0981 Nasal congestion: Secondary | ICD-10-CM

## 2018-11-13 DIAGNOSIS — H60393 Other infective otitis externa, bilateral: Secondary | ICD-10-CM

## 2018-11-13 DIAGNOSIS — H6123 Impacted cerumen, bilateral: Secondary | ICD-10-CM

## 2018-11-13 MED ORDER — FLUTICASONE PROPIONATE 50 MCG/ACT NA SUSP: 2.0000 | Freq: Every day | NASAL | 0 refills | Status: DC

## 2018-11-13 MED ORDER — OFLOXACIN 0.3 % OP SOLN: 1.0000 [drp] | OPHTHALMIC | 0 refills | Status: DC

## 2018-11-13 NOTE — Patient Instructions (Addendum)
Mucinex DM for cough and congestion can be bought over the counter  Earwax Buildup, Adult The ears produce a substance called earwax that helps keep bacteria out of the ear and protects the skin in the ear canal. Occasionally, earwax can build up in the ear and cause discomfort or hearing loss. What increases the risk? This condition is more likely to develop in people who:  Are female.  Are elderly.  Naturally produce more earwax.  Clean their ears often with cotton swabs.  Use earplugs often.  Use in-ear headphones often.  Wear hearing aids.  Have narrow ear canals.  Have earwax that is overly thick or sticky.  Have eczema.  Are dehydrated.  Have excess hair in the ear canal. What are the signs or symptoms? Symptoms of this condition include:  Reduced or muffled hearing.  A feeling of fullness in the ear or feeling that the ear is plugged.  Fluid coming from the ear.  Ear pain.  Ear itch.  Ringing in the ear.  Coughing.  An obvious piece of earwax that can be seen inside the ear canal. How is this diagnosed? This condition may be diagnosed based on:  Your symptoms.  Your medical history.  An ear exam. During the exam, your health care provider will look into your ear with an instrument called an otoscope. You may have tests, including a hearing test. How is this treated? This condition may be treated by:  Using ear drops to soften the earwax.  Having the earwax removed by a health care provider. The health care provider may: ? Flush the ear with water. ? Use an instrument that has a loop on the end (curette). ? Use a suction device.  Surgery to remove the wax buildup. This may be done in severe cases. Follow these instructions at home:   Take over-the-counter and prescription medicines only as told by your health care provider.  Do not put any objects, including cotton swabs, into your ear. You can clean the opening of your ear canal with a  washcloth or facial tissue.  Follow instructions from your health care provider about cleaning your ears. Do not over-clean your ears.  Drink enough fluid to keep your urine clear or pale yellow. This will help to thin the earwax.  Keep all follow-up visits as told by your health care provider. If earwax builds up in your ears often or if you use hearing aids, consider seeing your health care provider for routine, preventive ear cleanings. Ask your health care provider how often you should schedule your cleanings.  If you have hearing aids, clean them according to instructions from the manufacturer and your health care provider. Contact a health care provider if:  You have ear pain.  You develop a fever.  You have blood, pus, or other fluid coming from your ear.  You have hearing loss.  You have ringing in your ears that does not go away.  Your symptoms do not improve with treatment.  You feel like the room is spinning (vertigo). Summary  Earwax can build up in the ear and cause discomfort or hearing loss.  The most common symptoms of this condition include reduced or muffled hearing and a feeling of fullness in the ear or feeling that the ear is plugged.  This condition may be diagnosed based on your symptoms, your medical history, and an ear exam.  This condition may be treated by using ear drops to soften the earwax or by having  the earwax removed by a health care provider.  Do not put any objects, including cotton swabs, into your ear. You can clean the opening of your ear canal with a washcloth or facial tissue. This information is not intended to replace advice given to you by your health care provider. Make sure you discuss any questions you have with your health care provider. Document Released: 11/02/2004 Document Revised: 09/06/2017 Document Reviewed: 12/06/2016 Elsevier Interactive Patient Education  2019 [employer]

## 2018-11-13 NOTE — Progress Notes (Signed)
Patient: Joanna Padilla Female    DOB: September 07, 1986   33 y.o.   MRN: [id number] Visit Date: 11/13/2018  Today's Provider: Margaretann Loveless, PA-C   Chief Complaint  Patient presents with  . URI   Subjective:     URI   This is a new problem. The current episode started in the past 7 days (Started on Saturday). The problem has been gradually worsening. There has been no fever. Associated symptoms include congestion, coughing, diarrhea, ear pain, headaches, a plugged ear sensation, rhinorrhea and a sore throat. Pertinent negatives include no chest pain, sinus pain, vomiting or wheezing. She has tried decongestant and increased fluids (TheraFlu,dayquil vapor extra strength,home remedies) for the symptoms. The treatment provided mild relief.    Allergies  Allergen Reactions  . Penicillins Anaphylaxis and Hives    Has patient had a PCN reaction causing immediate rash, facial/tongue/throat swelling, SOB or lightheadedness with hypotension: Yes Has patient had a PCN reaction causing severe rash involving mucus membranes or skin necrosis: No Has patient had a PCN reaction that required hospitalization No Has patient had a PCN reaction occurring within the last 10 years: No If all of the above answers are "NO", then may proceed with Cephalosporin use.   . Latex Itching  . Penicillins Hives     Current Outpatient Medications:  .  LO LOESTRIN FE 1 MG-10 MCG / 10 MCG tablet, TK 1 T PO D, Disp: , Rfl: 0  Review of Systems  Constitutional: Negative for fever.  HENT: Positive for congestion, ear pain, rhinorrhea, sinus pressure and sore throat. Negative for postnasal drip and sinus pain.   Respiratory: Positive for cough and shortness of breath. Negative for chest tightness and wheezing.   Cardiovascular: Negative for chest pain, palpitations and leg swelling.  Gastrointestinal: Positive for diarrhea. Negative for vomiting.  Neurological: Positive for headaches.    Social History    Tobacco Use  . Smoking status: Never Smoker  . Smokeless tobacco: Never Used  Substance Use Topics  . Alcohol use: No      Objective:   BP 115/80 (BP Location: Left Arm, Patient Position: Sitting, Cuff Size: Large)   Pulse 100   Temp 99.1 F (37.3 C) (Oral)   Resp 16   Wt 200 lb 9.6 oz (91 kg)   LMP 03/18/2018   SpO2 97%   BMI 35.53 kg/m  Vitals:   11/13/18 0752  BP: 115/80  Pulse: 100  Resp: 16  Temp: 99.1 F (37.3 C)  TempSrc: Oral  SpO2: 97%  Weight: 200 lb 9.6 oz (91 kg)     Physical Exam Vitals signs reviewed.  Constitutional:      General: She is not in acute distress.    Appearance: She is well-developed. She is obese. She is not diaphoretic.  HENT:     Head: Normocephalic and atraumatic.     Comments: Cerumen impaction was noted bilaterally. After lavage the external canals were swollen, red and had a white fluffy discharge. TM normal.     Right Ear: Hearing, tympanic membrane, ear canal and external ear normal. Drainage, swelling and tenderness present. No middle ear effusion. Tympanic membrane is not erythematous or bulging.     Left Ear: Hearing, tympanic membrane, ear canal and external ear normal. Drainage, swelling and tenderness present.  No middle ear effusion. Tympanic membrane is not erythematous or bulging.     Nose: Nose normal.     Mouth/Throat:  Pharynx: Uvula midline. No oropharyngeal exudate.  Eyes:     General: No scleral icterus.       Right eye: No discharge.        Left eye: No discharge.     Conjunctiva/sclera: Conjunctivae normal.     Pupils: Pupils are equal, round, and reactive to light.  Neck:     Musculoskeletal: Normal range of motion and neck supple.     Thyroid: No thyromegaly.     Trachea: No tracheal deviation.  Cardiovascular:     Rate and Rhythm: Normal rate and regular rhythm.     Heart sounds: Normal heart sounds. No murmur. No friction rub. No gallop.   Pulmonary:     Effort: Pulmonary effort is normal. No  respiratory distress.     Breath sounds: Normal breath sounds. No stridor. No wheezing or rales.  Lymphadenopathy:     Cervical: No cervical adenopathy.  Skin:    General: Skin is warm and dry.  Neurological:     Mental Status: She is alert.         Assessment & Plan    1. Bilateral impacted cerumen Lavage successful bilaterally.  - Ear Lavage  2. Sinus congestion Add flonase for sinus congestion and rhinorrhea.  - fluticasone (FLONASE) 50 MCG/ACT nasal spray; Place 2 sprays into both nostrils daily.  Dispense: 16 g; Refill: 0  3. Other infective acute otitis externa of both ears Cortisporin drops were given to use 4 times daily in each ear. Call is symptoms worsen.   4. Cough Advised to use Mucinex DM or Delsym for cough. Call if worsening.      Margaretann Loveless, PA-C  [facility]

## 2018-11-15 ENCOUNTER — Ambulatory Visit (INDEPENDENT_AMBULATORY_CARE_PROVIDER_SITE_OTHER): Payer: Self-pay | Admitting: Physician Assistant

## 2018-11-15 ENCOUNTER — Encounter: Payer: Self-pay | Admitting: Physician Assistant

## 2018-11-15 ENCOUNTER — Telehealth: Payer: Self-pay

## 2018-11-15 VITALS — BP 118/78 | HR 84 | Temp 98.3°F | Resp 16

## 2018-11-15 DIAGNOSIS — J0140 Acute pansinusitis, unspecified: Secondary | ICD-10-CM

## 2018-11-15 MED ORDER — DOXYCYCLINE HYCLATE 100 MG PO TABS: 100.0000 mg | ORAL_TABLET | Freq: Two times a day (BID) | ORAL | 0 refills | Status: DC

## 2018-11-15 MED ORDER — PREDNISONE 20 MG PO TABS: 20.0000 mg | ORAL_TABLET | Freq: Every day | ORAL | 0 refills | Status: DC

## 2018-11-15 NOTE — Telephone Encounter (Signed)
Opened in error

## 2018-11-15 NOTE — Progress Notes (Signed)
Patient: Joanna Padilla Female    DOB: 03-17-1986   33 y.o.   MRN: [id number] Visit Date: 11/15/2018  Today's Provider: Margaretann Loveless, PA-C   No chief complaint on file.  Subjective:     HPI Patient here today with c/o not feeling any better. Patient was seen 2 days ago. Reports that her chest feels more tight. Sinus congestion worse. Still no fever but having productive cough, worsening sinus congestion.   Allergies  Allergen Reactions  . Penicillins Anaphylaxis and Hives    Has patient had a PCN reaction causing immediate rash, facial/tongue/throat swelling, SOB or lightheadedness with hypotension: Yes Has patient had a PCN reaction causing severe rash involving mucus membranes or skin necrosis: No Has patient had a PCN reaction that required hospitalization No Has patient had a PCN reaction occurring within the last 10 years: No If all of the above answers are "NO", then may proceed with Cephalosporin use.   . Latex Itching  . Penicillins Hives     Current Outpatient Medications:  .  fluticasone (FLONASE) 50 MCG/ACT nasal spray, Place 2 sprays into both nostrils daily., Disp: 16 g, Rfl: 0 .  ofloxacin (OCUFLOX) 0.3 % ophthalmic solution, Place 1 drop into both ears every 4 (four) hours., Disp: 5 mL, Rfl: 0 .  LO LOESTRIN FE 1 MG-10 MCG / 10 MCG tablet, TK 1 T PO D, Disp: , Rfl: 0  Review of Systems  Constitutional: Positive for chills and fatigue.  HENT: Positive for congestion, postnasal drip, sinus pain and sore throat. Negative for ear pain.   Respiratory: Positive for cough and chest tightness. Negative for shortness of breath and wheezing.   Cardiovascular: Negative for chest pain and palpitations.  Gastrointestinal: Negative for abdominal pain and nausea.  Neurological: Positive for headaches. Negative for dizziness.    Social History   Tobacco Use  . Smoking status: Never Smoker  . Smokeless tobacco: Never Used  Substance Use Topics  . Alcohol  use: No      Objective:   BP 118/78 (BP Location: Right Arm, Patient Position: Sitting, Cuff Size: Large)   Pulse 84   Temp 98.3 F (36.8 C) (Oral)   Resp 16   LMP 03/18/2018   SpO2 99%  Vitals:   11/15/18 1808  BP: 118/78  Pulse: 84  Resp: 16  Temp: 98.3 F (36.8 C)  TempSrc: Oral  SpO2: 99%     Physical Exam Vitals signs reviewed.  Constitutional:      General: She is not in acute distress.    Appearance: She is well-developed. She is ill-appearing. She is not diaphoretic.  HENT:     Head: Normocephalic and atraumatic.     Right Ear: Hearing, tympanic membrane, ear canal and external ear normal.     Left Ear: Hearing, tympanic membrane, ear canal and external ear normal.     Nose:     Right Sinus: Maxillary sinus tenderness and frontal sinus tenderness present.     Left Sinus: Maxillary sinus tenderness and frontal sinus tenderness present.     Mouth/Throat:     Pharynx: Uvula midline. Posterior oropharyngeal erythema present. No oropharyngeal exudate.  Neck:     Musculoskeletal: Normal range of motion and neck supple.     Thyroid: No thyromegaly.     Trachea: No tracheal deviation.  Cardiovascular:     Rate and Rhythm: Normal rate and regular rhythm.     Heart sounds: Normal heart sounds. No  murmur. No friction rub. No gallop.   Pulmonary:     Effort: Pulmonary effort is normal. No respiratory distress.     Breath sounds: Normal breath sounds. No stridor. No wheezing or rales.  Lymphadenopathy:     Cervical: No cervical adenopathy.  Neurological:     Mental Status: She is alert.         Assessment & Plan    1. Acute non-recurrent pansinusitis Worsening symptoms that have not responded to OTC medications. Will give doxycycline and prednisone as below. Continue allergy medications. Stay well hydrated and get plenty of rest. Call if no symptom improvement or if symptoms worsen. - doxycycline (VIBRA-TABS) 100 MG tablet; Take 1 tablet (100 mg total) by mouth  2 (two) times daily.  Dispense: 20 tablet; Refill: 0 - predniSONE (DELTASONE) 20 MG tablet; Take 1 tablet (20 mg total) by mouth daily with breakfast.  Dispense: 5 tablet; Refill: 0     Margaretann LovelessJennifer M Burnette, PA-C  [facility]

## 2018-11-15 NOTE — Patient Instructions (Signed)

## 2018-11-20 ENCOUNTER — Telehealth: Payer: Self-pay | Admitting: Physician Assistant

## 2018-11-20 NOTE — Telephone Encounter (Signed)
Pt was in last Friday and seen [facility] for a sinus infection.  Pt has doctors note saying she is to return to work on Wednesday 11-20-18 but it needed to say she should return to work tomorrow.  She is still having symptoms.  She just noticed the note she has had today instead of tomorrow.  She will need an updated note for her fmla claim  She would like a call back at [phone]  Thanks teri

## 2018-11-20 NOTE — Telephone Encounter (Signed)
Pt calling about: FMLA paperwork was faxed on Sat. Needing return to work note to be adjusted.   Please call pt back to discuss. Please call [phone].  Thanks, [employer]

## 2018-11-20 NOTE — Telephone Encounter (Signed)
Please Review

## 2018-11-21 ENCOUNTER — Telehealth: Payer: Self-pay

## 2018-11-21 ENCOUNTER — Telehealth: Payer: Self-pay | Admitting: Physician Assistant

## 2018-11-21 NOTE — Telephone Encounter (Signed)
Work note printed, signed, and faxed.

## 2018-11-21 NOTE — Telephone Encounter (Signed)
Work note corrected and has been printed.

## 2018-11-21 NOTE — Telephone Encounter (Signed)
Note was faxed today 11/21/2018.

## 2018-11-21 NOTE — Telephone Encounter (Signed)
Clayburn Pert called back stating that the patient called him saying that she was seen by Joycelyn Man also and was given a work note. Clayburn Pert states the note needs to state the exact days that she needed to be out of work and the return date. Clayburn Pert ask that [place] consult with each other about this note, because patient will only be allowed to submit one letter, and if it doesn't include the correct dates, she may be denied.

## 2018-11-21 NOTE — Telephone Encounter (Signed)
Updated work note provided today as patient showed up to clinic. I am not aware of FMLA forms as I did not see patient in clinic.

## 2018-11-21 NOTE — Telephone Encounter (Signed)
Evan from [employer]ML[employer] short term disability (which is a 3rd party disability company for [employer]) called stating that he needs an out of work note for patient stating that she was sick and needed to be out of work from the dates of 11/16/2018 through 11/20/2018. Clayburn Pert states he came back to work today, and the note needs to say she was able to return to work today 11/21/2018. Please fax note to fax number ([phone]). Call back is [phone].

## 2018-11-22 NOTE — Telephone Encounter (Signed)
Ricki Rodriguez completed another work note on yesterday for patient due to the [employer] company wanting it to be one letter. Work note was also faxed to the company.

## 2018-11-25 NOTE — Telephone Encounter (Signed)
FMLA paper work faxed today to [phone]

## 2020-06-11 ENCOUNTER — Ambulatory Visit: Payer: Medicaid Other | Attending: Internal Medicine

## 2020-06-11 DIAGNOSIS — Z23 Encounter for immunization: Secondary | ICD-10-CM

## 2020-06-11 NOTE — Progress Notes (Signed)
   Covid-19 Vaccination Clinic  Name:  Joanna Padilla    MRN: [id number] DOB: 11/15/1985  06/11/2020  Joanna Padilla was observed post Covid-19 immunization for 30 minutes based on pre-vaccination screening .  During the observation period, she experienced an adverse reaction with the following symptoms:  dizziness.rapid heart rate, and nausea.  Assessment : Time of assessment  Anxious.  Actions taken:  Vitals sign taken 13:06  BP 137/107    64 pulse    99 O2                             13:19    BP 128/99      74 pulse   99 O2 There were no vitals filed for this visit.  Medications administered: No medication administered.  Disposition: Reports no further symptoms of adverse reaction after observation for 60  minutes. Discharged home.   Immunizations Administered    Name Date Dose VIS Date Route   Pfizer COVID-19 Vaccine 06/11/2020 12:28 PM 0.3 mL 12/03/2018 Intramuscular   Manufacturer: [employer], [employer]   Lot: [id number]   NDC: [address]-0

## 2020-06-30 ENCOUNTER — Telehealth: Payer: Self-pay | Admitting: *Deleted

## 2020-06-30 NOTE — Telephone Encounter (Signed)
Called and left a voicemail asking for patient to return call in regards to the COVID vaccine that she received on 06/11/2020.

## 2020-07-05 NOTE — Telephone Encounter (Signed)
Patient called back. Please advise.

## 2020-07-06 ENCOUNTER — Ambulatory Visit: Payer: Medicaid Other | Attending: Internal Medicine

## 2020-07-06 DIAGNOSIS — Z23 Encounter for immunization: Secondary | ICD-10-CM

## 2020-07-06 NOTE — Progress Notes (Signed)
   Covid-19 Vaccination Clinic  Name:  Joanna Padilla    MRN: [id number] DOB: 1986/04/26  07/06/2020  [name] was observed post Covid-19 immunization for 30 minutes based on pre-vaccination screening without incident. She was provided with Vaccine Information Sheet and instruction to access the V-Safe system.   [name] was instructed to call 911 with any severe reactions post vaccine: Marland Kitchen Difficulty breathing  . Swelling of face and throat  . A fast heartbeat  . A bad rash all over body  . Dizziness and weakness   Immunizations Administered    Name Date Dose VIS Date Route   Pfizer COVID-19 Vaccine 07/06/2020 11:44 AM 0.3 mL 12/03/2018 Intramuscular   Manufacturer: [employer], [employer]   Lot: [id number]   NDC: [address]-1

## 2020-07-06 NOTE — Telephone Encounter (Signed)
Patient called again because no one called her back yesterday. Patient states that she is doing alright from her first COVID vaccine. Patient states it was mostly her anxiety that made her short of breath for a few days after. Patient states her boyfriend got the shot and it put him down. Patient going to get her second vaccine today at 11:30am and is feeling very anxious.   Please advise. Patient is still expecting a call from a nurse.

## 2021-01-23 ENCOUNTER — Emergency Department: Payer: 59

## 2021-01-23 ENCOUNTER — Other Ambulatory Visit: Payer: Self-pay

## 2021-01-23 ENCOUNTER — Emergency Department
Admission: EM | Admit: 2021-01-23 | Discharge: 2021-01-23 | Disposition: A | Discharge status: Home / Self Care | Payer: 59 | Attending: Emergency Medicine | Admitting: Emergency Medicine

## 2021-01-23 DIAGNOSIS — O99511 Diseases of the respiratory system complicating pregnancy, first trimester: Secondary | ICD-10-CM | POA: Diagnosis not present

## 2021-01-23 DIAGNOSIS — R102 Pelvic and perineal pain: Secondary | ICD-10-CM

## 2021-01-23 DIAGNOSIS — J45909 Unspecified asthma, uncomplicated: Secondary | ICD-10-CM | POA: Insufficient documentation

## 2021-01-23 DIAGNOSIS — O039 Complete or unspecified spontaneous abortion without complication: Secondary | ICD-10-CM | POA: Insufficient documentation

## 2021-01-23 DIAGNOSIS — Z3A01 Less than 8 weeks gestation of pregnancy: Secondary | ICD-10-CM | POA: Diagnosis not present

## 2021-01-23 DIAGNOSIS — Z91040 Latex allergy status: Secondary | ICD-10-CM | POA: Insufficient documentation

## 2021-01-23 DIAGNOSIS — O4691 Antepartum hemorrhage, unspecified, first trimester: Secondary | ICD-10-CM | POA: Diagnosis present

## 2021-01-23 LAB — CBC
HCT: 34 % — ABNORMAL LOW (ref 36.0–46.0)
Hemoglobin: 11.6 g/dL — ABNORMAL LOW (ref 12.0–15.0)
MCH: 28.8 pg (ref 26.0–34.0)
MCHC: 34.1 g/dL (ref 30.0–36.0)
MCV: 84.4 fL (ref 80.0–100.0)
Platelets: 422 10*3/uL — ABNORMAL HIGH (ref 150–400)
RBC: 4.03 MIL/uL (ref 3.87–5.11)
RDW: 11.6 % (ref 11.5–15.5)
WBC: 6.2 10*3/uL (ref 4.0–10.5)
nRBC: 0 % (ref 0.0–0.2)

## 2021-01-23 LAB — BASIC METABOLIC PANEL
Anion gap: 8 (ref 5–15)
BUN: 12 mg/dL (ref 6–20)
CO2: 26 mmol/L (ref 22–32)
Calcium: 8.9 mg/dL (ref 8.9–10.3)
Chloride: 102 mmol/L (ref 98–111)
Creatinine, Ser: 0.69 mg/dL (ref 0.44–1.00)
GFR, Estimated: >60 mL/min (ref >60)
Glucose, Bld: 127 mg/dL — ABNORMAL HIGH (ref 70–99)
Potassium: 3.9 mmol/L (ref 3.5–5.1)
Sodium: 136 mmol/L (ref 135–145)

## 2021-01-23 LAB — WET PREP, GENITAL
Sperm: NONE SEEN
Trich, Wet Prep: NONE SEEN
Yeast Wet Prep HPF POC: NONE SEEN

## 2021-01-23 LAB — CHLAMYDIA/NGC RT PCR (ARMC ONLY)
Chlamydia Tr: NOT DETECTED
N gonorrhoeae: NOT DETECTED

## 2021-01-23 LAB — HCG, QUANTITATIVE, PREGNANCY: hCG, Beta Chain, Quant, S: 3 m[IU]/mL (ref <5)

## 2021-01-23 NOTE — ED Provider Notes (Signed)
[facility] Emergency Department Provider Note   ____________________________________________   Event Date/Time   First MD Initiated Contact with Patient 01/23/21 [phone]     (approximate)  I have reviewed the triage vital signs and the nursing notes.   HISTORY  Chief Complaint Vaginal Bleeding    HPI Joanna Padilla is a 35 y.o. female with past medical history of asthma and sickle cell trait who presents to the ED complaining of vaginal bleeding.  Patient reports that she had a positive pregnancy test over the weekend with her LMP coming at the end of February.  This would be her fifth pregnancy and she had 2 term births along with 2 elective abortions in the past.  She reports noticing bleeding primarily when she wipes, has not passed any tissue or large clots.  She has had occasional abdominal cramping, denies nausea, vomiting, fever, dysuria, or hematuria.  She has not had any changes in her bowel movements.        Past Medical History:  Diagnosis Date  . Abnormal Pap smear   . Acute bronchitis   . Anxiety   . Asthma   . Chlamydia infection   . Infection    UTI  . Obesity, unspecified   . Seasonal allergies   . Sickle cell trait (HCC)   . Unspecified asthma(493.90)   . Urinary tract infection     Patient Active Problem List   Diagnosis Date Noted  . Genital warts 06/14/2018  . Anxiety and depression 06/14/2018  . Severe dysplasia of cervix (CIN III) 07/14/2015  . Pap smear abnormality of cervix with LGSIL 05/27/2013  . Sickle cell trait (HCC) 03/09/2013  . Allergy to penicillin 03/09/2013  . OBESITY 12/26/2010  . ADJUSTMENT DISORDER 12/26/2010  . ASTHMA 12/26/2010    Past Surgical History:  Procedure Laterality Date  . ABDOMINAL SURGERY     c-section  . CERVICAL BIOPSY  W/ LOOP ELECTRODE EXCISION  2016  . CESAREAN SECTION    . CESAREAN SECTION    . LEEP  07/2015  . WISDOM TOOTH EXTRACTION      Prior to Admission medications    Medication Sig Start Date End Date Taking? Authorizing Provider  cetirizine (ZYRTEC) 10 MG tablet Take 10 mg by mouth daily.   Yes [provider]  Prenatal Vit-Fe Fumarate-FA (PRENATAL VITAMINS) 28-0.8 MG TABS Take 2 tablets by mouth daily.   Yes [provider]  doxycycline (VIBRA-TABS) 100 MG tablet Take 1 tablet (100 mg total) by mouth 2 (two) times daily. Patient not taking: No sig reported 11/15/18   Margaretann Loveless, PA-C  fluticasone [facility]) 50 MCG/ACT nasal spray Place 2 sprays into both nostrils daily. Patient not taking: No sig reported 11/13/18   Joycelyn Man M, PA-C  LO LOESTRIN FE 1 MG-10 MCG / 10 MCG tablet TK 1 T PO D Patient not taking: No sig reported 05/03/18   [provider]  predniSONE (DELTASONE) 20 MG tablet Take 1 tablet (20 mg total) by mouth daily with breakfast. Patient not taking: No sig reported 11/15/18   Margaretann Loveless, PA-C    Allergies Penicillins, Latex, and Penicillins  Family History  Problem Relation Age of Onset  . Hyperlipidemia Mother   . Transient ischemic attack Mother   . Mental illness Mother   . Diabetes Mother   . Hypertension Mother   . Congestive Heart Failure Mother   . Diabetes Maternal Grandmother   . Hypertension Maternal Grandmother  Social History Social History   Tobacco Use  . Smoking status: Never Smoker  . Smokeless tobacco: Never Used  Vaping Use  . Vaping Use: Never used  Substance Use Topics  . Alcohol use: No  . Drug use: No    Review of Systems  Constitutional: No fever/chills Eyes: No visual changes. ENT: No sore throat. Cardiovascular: Denies chest pain. Respiratory: Denies shortness of breath. Gastrointestinal: Positive for abdominal pain.  No nausea, no vomiting.  No diarrhea.  No constipation. Genitourinary: Negative for dysuria.  Positive for vaginal bleeding. Musculoskeletal: Negative for back pain. Skin: Negative for rash. Neurological: Negative for  headaches, focal weakness or numbness.  ____________________________________________   PHYSICAL EXAM:  VITAL SIGNS: ED Triage Vitals [01/23/21 0820]  Enc Vitals Group     BP 131/76     Pulse Rate 89     Resp 20     Temp 98.7 F (37.1 C)     Temp Source Oral     SpO2 100 %     Weight      Height      Head Circumference      Peak Flow      Pain Score      Pain Loc      Pain Edu?      Excl. in GC?     Constitutional: Alert and oriented. Eyes: Conjunctivae are normal. Head: Atraumatic. Nose: No congestion/rhinnorhea. Mouth/Throat: Mucous membranes are moist. Neck: Normal ROM Cardiovascular: Normal rate, regular rhythm. Grossly normal heart sounds. Respiratory: Normal respiratory effort.  No retractions. Lungs CTAB. Gastrointestinal: Soft and nontender. No distention. Genitourinary: Scant blood in the vaginal vault with closed cervix, no cervical motion adnexal tenderness. Musculoskeletal: No lower extremity tenderness nor edema. Neurologic:  Normal speech and language. No gross focal neurologic deficits are appreciated. Skin:  Skin is warm, dry and intact. No rash noted. Psychiatric: Mood and affect are normal. Speech and behavior are normal.  ____________________________________________   LABS (all labs ordered are listed, but only abnormal results are displayed)  Labs Reviewed  WET PREP, GENITAL - Abnormal; Notable for the following components:      Result Value   Clue Cells Wet Prep HPF POC PRESENT (*)    WBC, Wet Prep HPF POC FEW (*)    All other components within normal limits  BASIC METABOLIC PANEL - Abnormal; Notable for the following components:   Glucose, Bld 127 (*)    All other components within normal limits  CBC - Abnormal; Notable for the following components:   Hemoglobin 11.6 (*)    HCT 34.0 (*)    Platelets 422 (*)    All other components within normal limits  CHLAMYDIA/NGC RT PCR ([facility] ONLY)  HCG, QUANTITATIVE, PREGNANCY  POC  URINE PREG, ED    PROCEDURES  Procedure(s) performed (including Critical Care):  Procedures   ____________________________________________   INITIAL IMPRESSION / ASSESSMENT AND PLAN / ED COURSE       35 year old female with past medical history of asthma and sickle cell trait who presents to the ED complaining of 2 days of vaginal bleeding with abdominal cramping after finding out she is pregnant about 1 week ago.  Pelvic exam shows scant blood with close cervical os, we will further assess with ultrasound.  Labs including beta hCG are pending.  Beta hCG is very low at 3 and pelvic ultrasound does not identify intrauterine pregnancy.  She does have nonspecific thickening of endometrium, potentially related to early pregnancy with associated miscarriage.  Remainder of labs are reassuring and patient is appropriate for discharge home with OB/GYN follow-up.  She states she has an appointment scheduled for later this month, was counseled to return to the ED for any new or worsening symptoms in the meantime.  Patient agrees with plan.      ____________________________________________   FINAL CLINICAL IMPRESSION(S) / ED DIAGNOSES  Final diagnoses:  Pelvic pain  Miscarriage     ED Discharge Orders    None       Note:  This document was prepared using Dragon voice recognition software and may include unintentional dictation errors.   Chesley Noon, MD 01/23/21 1236

## 2021-01-23 NOTE — ED Triage Notes (Signed)
Pt states she had a positive home pregnancy test last week and has noticed some bright red blood when she wipes with some intermittent abd pain/discomfort

## 2021-03-17 ENCOUNTER — Encounter: Payer: Self-pay | Admitting: Family Medicine

## 2021-03-17 ENCOUNTER — Other Ambulatory Visit: Payer: Self-pay

## 2021-03-17 ENCOUNTER — Ambulatory Visit (INDEPENDENT_AMBULATORY_CARE_PROVIDER_SITE_OTHER): Payer: 59 | Admitting: Family Medicine

## 2021-03-17 VITALS — BP 117/87 | Temp 98.7°F | Resp 16 | Ht 65.0 in | Wt 210.0 lb

## 2021-03-17 DIAGNOSIS — O039 Complete or unspecified spontaneous abortion without complication: Secondary | ICD-10-CM

## 2021-03-17 DIAGNOSIS — J45909 Unspecified asthma, uncomplicated: Secondary | ICD-10-CM

## 2021-03-17 NOTE — Progress Notes (Signed)
Established patient visit   Patient: Joanna Padilla   DOB: 1986/02/10   35 y.o. Female  MRN: [id number] Visit Date: 03/17/2021  Today's healthcare provider: Dortha Kern, PA-C   Chief Complaint  Patient presents with   Asthma    Subjective    Asthma She complains of shortness of breath and wheezing. There is no chest tightness, cough or difficulty breathing. This is a chronic problem. The problem occurs intermittently. The problem has been unchanged. Associated symptoms include dyspnea on exertion. Pertinent negatives include no appetite change, chest pain, ear congestion, ear pain, fever, headaches, heartburn, malaise/fatigue, myalgias, nasal congestion, orthopnea, PND, postnasal drip, rhinorrhea, sneezing, sore throat, sweats, trouble swallowing or weight loss. Her symptoms are aggravated by pollen, change in weather, climbing stairs and exercise. Her symptoms are alleviated by steroid inhaler (patient uses nebulizer once every 3 months.). She reports moderate improvement on treatment. Her past medical history is significant for asthma.     Patient Active Problem List   Diagnosis Date Noted   Genital warts 06/14/2018   Anxiety and depression 06/14/2018   Severe dysplasia of cervix (CIN III) 07/14/2015   Pap smear abnormality of cervix with LGSIL 05/27/2013   Sickle cell trait (HCC) 03/09/2013   Allergy to penicillin 03/09/2013   OBESITY 12/26/2010   ADJUSTMENT DISORDER 12/26/2010   ASTHMA 12/26/2010   Past Surgical History:  Procedure Laterality Date   ABDOMINAL SURGERY     c-section   CERVICAL BIOPSY  W/ LOOP ELECTRODE EXCISION  2016   CESAREAN SECTION     CESAREAN SECTION     LEEP  07/2015   WISDOM TOOTH EXTRACTION     Family History  Problem Relation Age of Onset   Hyperlipidemia Mother    Transient ischemic attack Mother    Mental illness Mother    Diabetes Mother    Hypertension Mother    Congestive Heart Failure Mother    Diabetes Maternal Grandmother     Hypertension Maternal Grandmother    Social History    Tobacco Use   Smoking status: Never   Smokeless tobacco: Never  Vaping Use   Vaping Use: Never used  Substance Use Topics   Alcohol use: No   Drug use: No   AllergiesAllergen Reactions   Penicillins Anaphylaxis and Hives    Has patient had a PCN reaction causing immediate rash, facial/tongue/throat swelling, SOB or lightheadedness with hypotension: Yes Has patient had a PCN reaction causing severe rash involving mucus membranes or skin necrosis: No Has patient had a PCN reaction that required hospitalization No Has patient had a PCN reaction occurring within the last 10 years: No If all of the above answers are "NO", then may proceed with Cephalosporin use.    Latex Itching   Penicillins Hives    Medications: Outpatient Medications Prior to Visit  Medication Sig   cetirizine (ZYRTEC) 10 MG tablet Take 10 mg by mouth as needed.   Prenatal Vit-Fe Fumarate-FA (PRENATAL VITAMINS) 28-0.8 MG TABS Take 2 tablets by mouth daily.   [DISCONTINUED] doxycycline (VIBRA-TABS) 100 MG tablet Take 1 tablet (100 mg total) by mouth 2 (two) times daily. (Patient not taking: No sig reported)   [DISCONTINUED] fluticasone (FLONASE) 50 MCG/ACT nasal spray Place 2 sprays into both nostrils daily. (Patient not taking: No sig reported)   [DISCONTINUED] LO LOESTRIN FE 1 MG-10 MCG / 10 MCG tablet TK 1 T PO D (Patient not taking: No sig reported)   [DISCONTINUED] predniSONE (DELTASONE) 20 MG  tablet Take 1 tablet (20 mg total) by mouth daily with breakfast. (Patient not taking: No sig reported)   No facility-administered medications prior to visit.    Review of Systems  Constitutional:  Negative for appetite change, fever, malaise/fatigue and weight loss.  HENT:  Negative for ear pain, postnasal drip, rhinorrhea, sneezing, sore throat and trouble swallowing.   Respiratory:  Positive for shortness of breath and wheezing. Negative for cough.    Cardiovascular:  Positive for dyspnea on exertion. Negative for chest pain and PND.  Gastrointestinal:  Negative for heartburn.  Musculoskeletal:  Negative for myalgias.  Neurological:  Negative for headaches.   Last CBC Lab Results  Component Value Date   WBC 6.2 01/23/2021   HGB 11.6 (L) 01/23/2021   HCT 34.0 (L) 01/23/2021   MCV 84.4 01/23/2021   MCH 28.8 01/23/2021   RDW 11.6 01/23/2021   PLT 422 (H) 01/23/2021   Last metabolic panel Lab Results  Component Value Date   GLUCOSE 127 (H) 01/23/2021   NA 136 01/23/2021   K 3.9 01/23/2021   CL 102 01/23/2021   CO2 26 01/23/2021   BUN 12 01/23/2021   CREATININE 0.69 01/23/2021   GFRNONAA >60 01/23/2021   GFRAA >60 04/17/2017   CALCIUM 8.9 01/23/2021   PROT 8.5 (H) 04/17/2017   ALBUMIN 4.4 04/17/2017   BILITOT 0.6 04/17/2017   ALKPHOS 55 04/17/2017   AST 16 04/17/2017   ALT 11 (L) 04/17/2017   ANIONGAP 8 01/23/2021       Objective    BP 117/87 (BP Location: Right Arm, Patient Position: Sitting, Cuff Size: Large)   Temp 98.7 F (37.1 C) (Temporal)   Resp 16   Wt 210 lb (95.3 kg)   LMP 02/15/2021 (Exact Date)   SpO2 98%   BMI 37.20 kg/m  BP Readings from Last 3 Encounters:  03/17/21 117/87  01/23/21 117/63  11/15/18 118/78   Wt Readings from Last 3 Encounters:  03/17/21 210 lb (95.3 kg)  11/13/18 200 lb 9.6 oz (91 kg)  07/01/18 206 lb (93.4 kg)       Physical Exam Constitutional:      General: She is not in acute distress.    Appearance: She is well-developed.  HENT:     Head: Normocephalic and atraumatic.     Right Ear: Hearing and tympanic membrane normal.     Left Ear: Hearing and tympanic membrane normal.     Nose: Nose normal.  Eyes:     General: Lids are normal. No scleral icterus.       Right eye: No discharge.        Left eye: No discharge.     Conjunctiva/sclera: Conjunctivae normal.  Neck:     Vascular: No carotid bruit.  Cardiovascular:     Rate and Rhythm: Normal rate and regular  rhythm.  Pulmonary:     Effort: Pulmonary effort is normal. No respiratory distress.     Breath sounds: Normal breath sounds. No wheezing.  Abdominal:     General: Bowel sounds are normal.     Palpations: Abdomen is soft.  Musculoskeletal:        General: Normal range of motion.     Cervical back: Neck supple.  Skin:    Findings: No lesion or rash.  Neurological:     Mental Status: She is alert and oriented to person, place, and time.  Psychiatric:        Speech: Speech normal.  Behavior: Behavior normal.        Thought Content: Thought content normal.      No results found for any visits on 03/17/21.  Assessment & Plan       No follow-ups on file.      I, Nickson Middlesworth, PA-C, have reviewed all documentation for this visit. The documentation on 03/17/21 for the exam, diagnosis, procedures, and orders are all accurate and complete.    Dortha Kern, PA-C  [facility] [phone] (phone) [phone] (fax)  [facility]  Addendum: I reviewed unsigned encounter notes after the retirement of [employer]. There is documentation regarding the management of patient's medical condition in this note. I am not aware of any additional clinical information regarding this visit.  No charge was submitted.

## 2021-06-15 IMAGING — US US PELVIS COMPLETE TRANSABD/TRANSVAG W DUPLEX
1 series · 13 of 25 positions shown · non-contrast
Comparison: None.

CLINICAL DATA: Pelvic pain for 1 day.

EXAM:
TRANSABDOMINAL AND TRANSVAGINAL ULTRASOUND OF PELVIS
DOPPLER ULTRASOUND OF OVARIES
TECHNIQUE: Both transabdominal and transvaginal ultrasound examinations of the
pelvis were performed. Transabdominal technique was performed for
global imaging of the pelvis including uterus, ovaries, adnexal
regions, and pelvic cul-de-sac.
It was necessary to proceed with endovaginal exam following the
transabdominal exam to visualize the endometrium and bilateral
ovaries. Color and duplex Doppler ultrasound was utilized to
evaluate blood flow to the ovaries.

[Series 1: us pelvic complete w transvaginal and torsion righ · 13 of 68 slices shown]
[im 1/68]
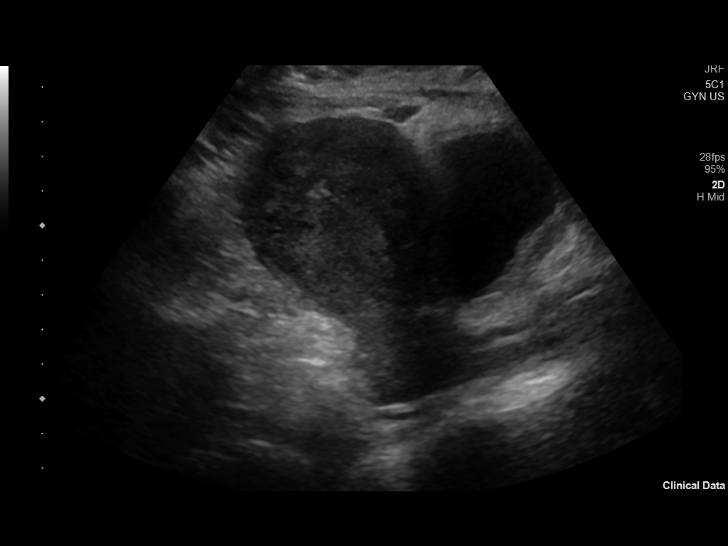
[im 6/68]
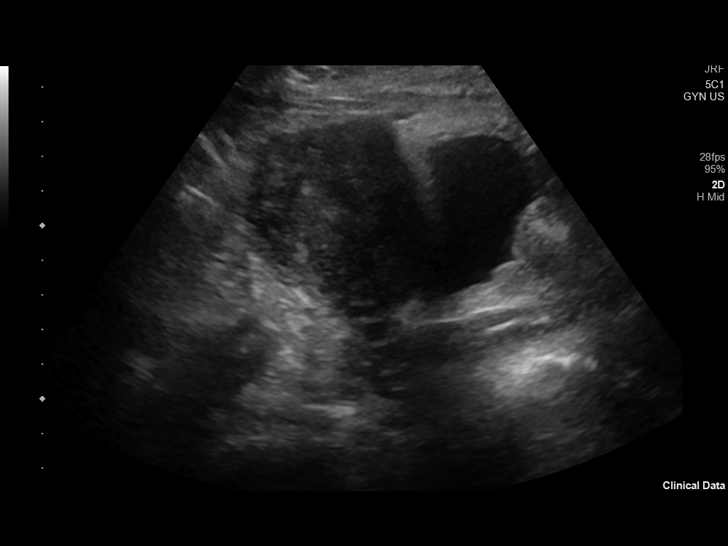
[im 12/68]
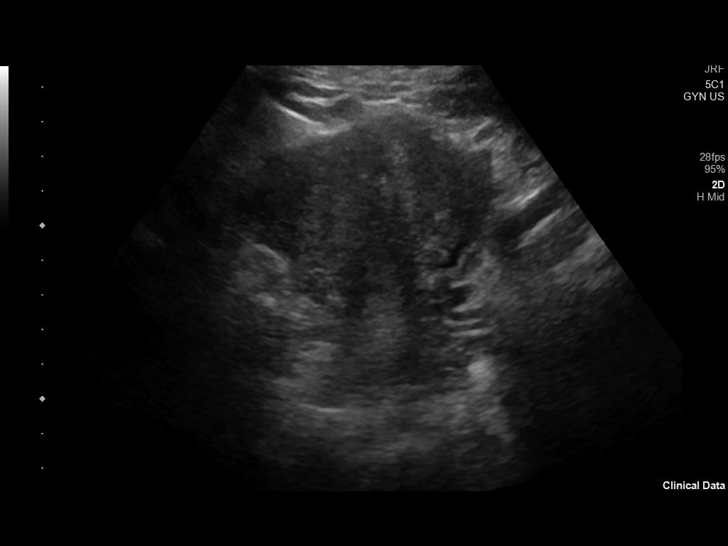
[im 17/68]
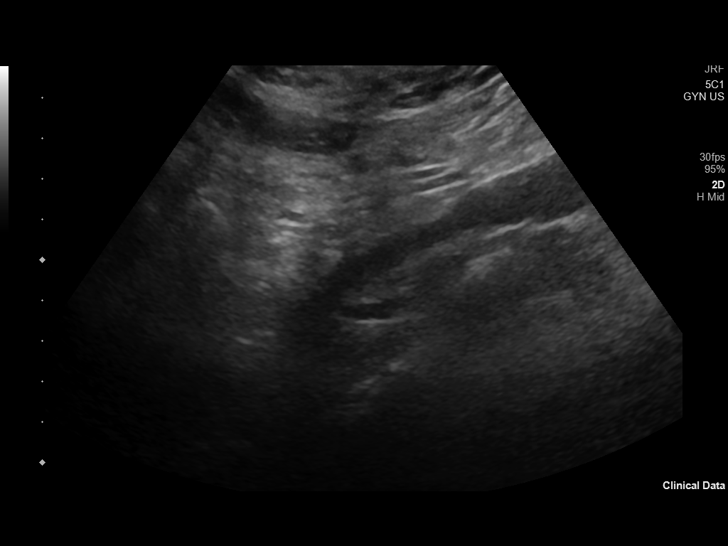
[im 23/68]
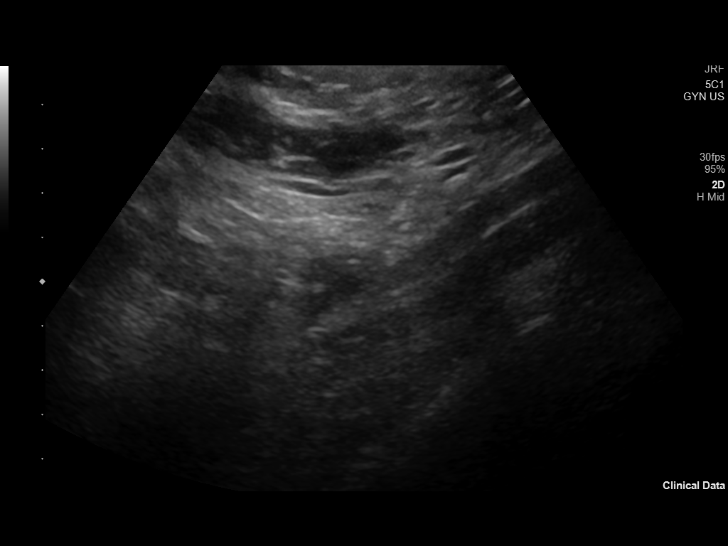
[im 28/68]
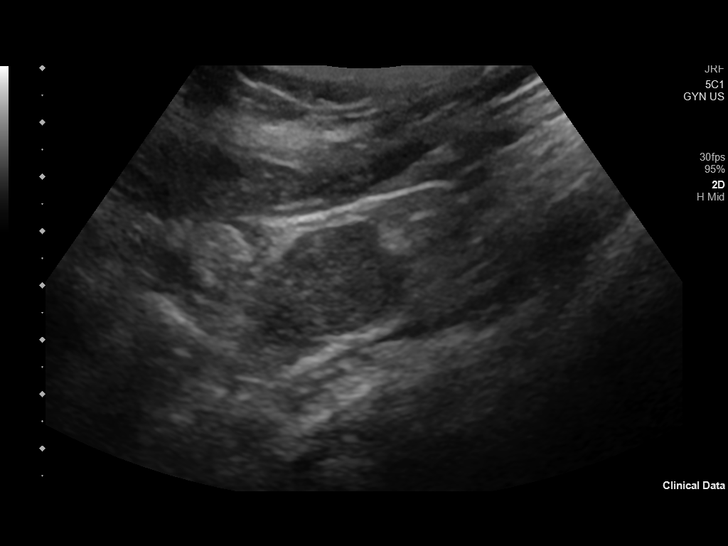
[im 34/68]
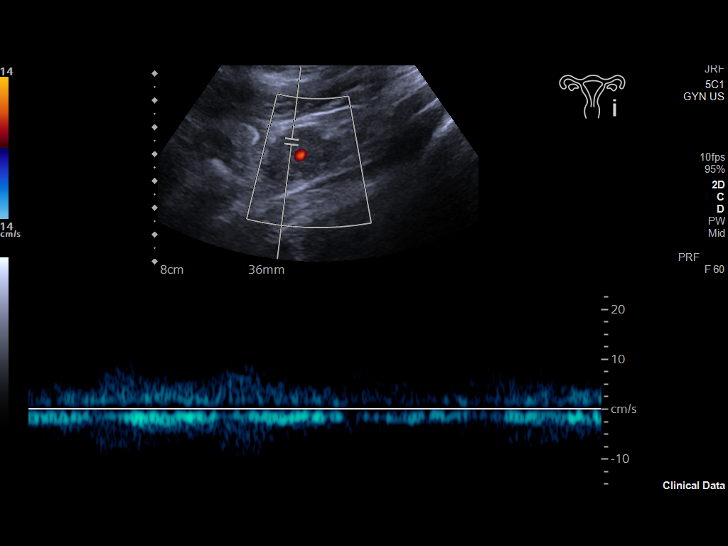
[im 40/68]
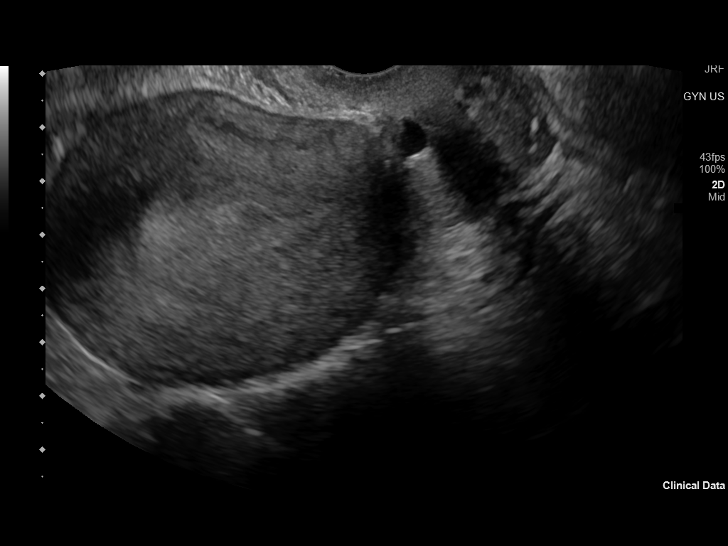
[im 45/68]
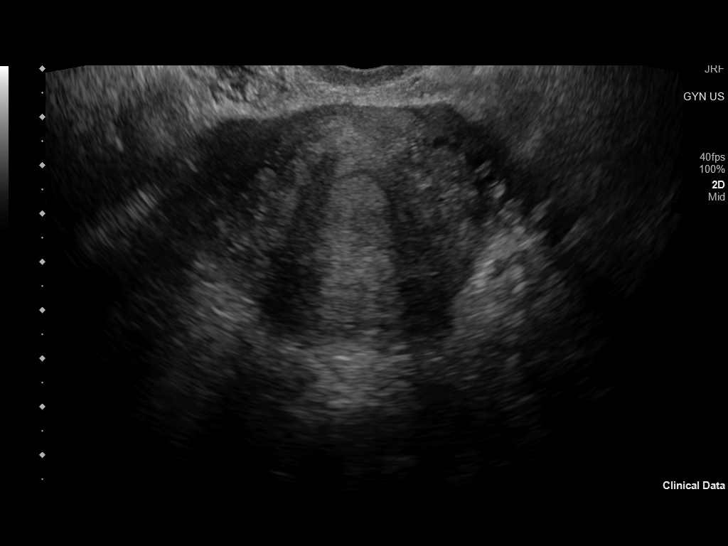
[im 51/68]
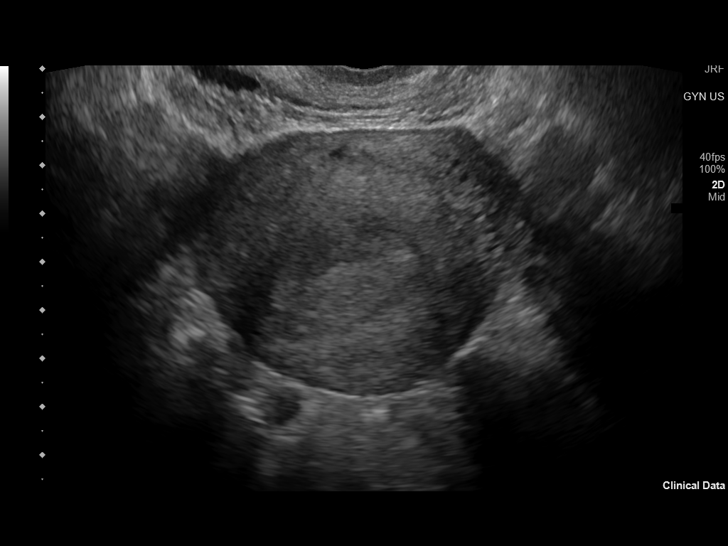
[im 56/68]
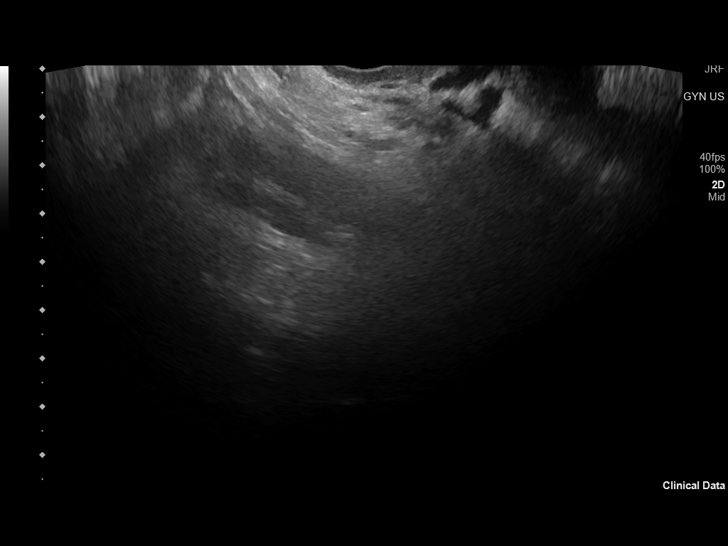
[im 62/68]
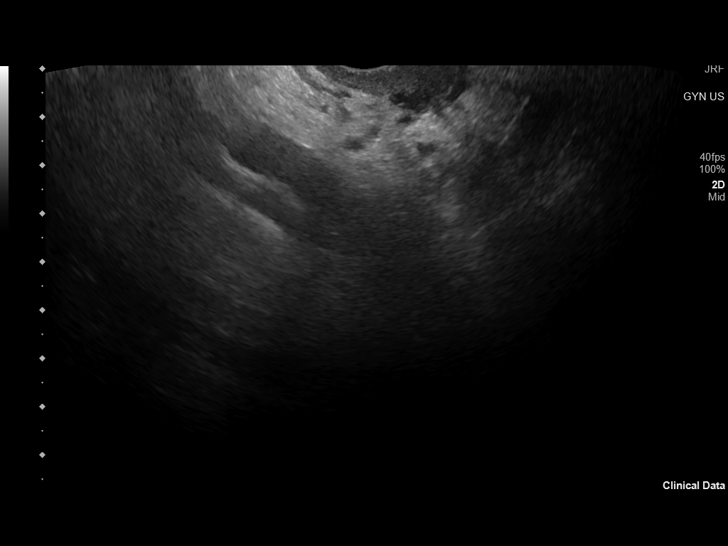
[im 68/68]
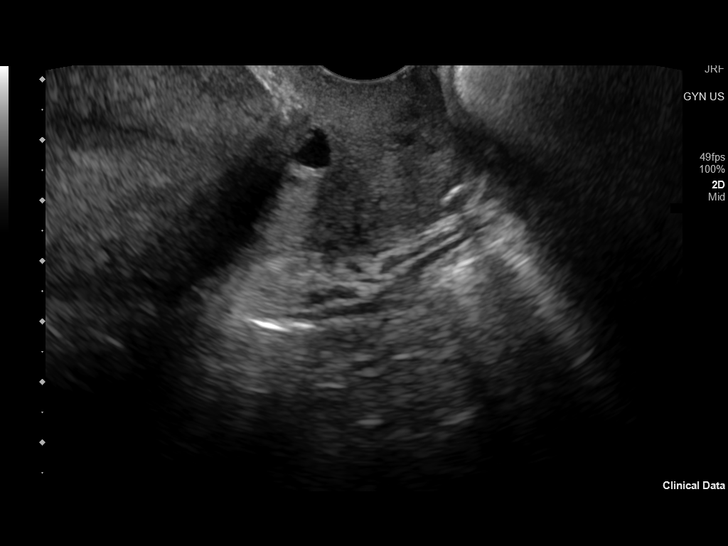

[13 of 25 positions shown; findings below may reference images not displayed]

FINDINGS: Uterus

Measurements: 9.8 x 5.7 x 6.1 cm = volume: 176 mL. No fibroids or
other mass visualized.

Endometrium

Thickness: 1.8 cm.  No focal abnormality visualized.

Right ovary

Unable to visualize.

Left ovary

Measurements: 3.1 x 2.1 x 2.5 cm = volume: 8 mL. Normal
appearance/no adnexal mass.

Pulsed Doppler evaluation of left ovary demonstrates normal
low-resistance arterial and venous waveforms.

Other findings

No abnormal free fluid.
IMPRESSION: 1. No sonographic finding to explain the patient's pelvic pain. Of
note the right ovary could not be visualized on this exam.

2.  Normal appearance of the left ovary.

3. Endometrial thickness is considered borderline abnormal. Consider
follow-up by US in 6-8 weeks, during the week immediately following
menses (exam timing is critical).
